# Patient Record
Sex: Female | Born: 1960 | Hispanic: Yes | Marital: Single | Smoking: Never smoker
Health system: Southern US, Community
[De-identification: ages and names within clinical notes are randomized; demographics above are authoritative.]

## PROBLEM LIST (undated history)

## (undated) DIAGNOSIS — H6090 Unspecified otitis externa, unspecified ear: Secondary | ICD-10-CM

## (undated) DIAGNOSIS — R011 Cardiac murmur, unspecified: Secondary | ICD-10-CM

## (undated) DIAGNOSIS — N926 Irregular menstruation, unspecified: Secondary | ICD-10-CM

## (undated) DIAGNOSIS — D509 Iron deficiency anemia, unspecified: Secondary | ICD-10-CM

## (undated) DIAGNOSIS — I10 Essential (primary) hypertension: Secondary | ICD-10-CM

## (undated) DIAGNOSIS — I8390 Asymptomatic varicose veins of unspecified lower extremity: Secondary | ICD-10-CM

## (undated) DIAGNOSIS — M1990 Unspecified osteoarthritis, unspecified site: Secondary | ICD-10-CM

## (undated) DIAGNOSIS — K219 Gastro-esophageal reflux disease without esophagitis: Secondary | ICD-10-CM

## (undated) HISTORY — PX: OTHER SURGICAL HISTORY: SHX169

## (undated) HISTORY — DX: Irregular menstruation, unspecified: N92.6

## (undated) HISTORY — DX: Cardiac murmur, unspecified: R01.1

## (undated) HISTORY — DX: Asymptomatic varicose veins of unspecified lower extremity: I83.90

## (undated) HISTORY — DX: Gastro-esophageal reflux disease without esophagitis: K21.9

## (undated) HISTORY — DX: Essential (primary) hypertension: I10

## (undated) HISTORY — DX: Unspecified osteoarthritis, unspecified site: M19.90

## (undated) HISTORY — DX: Unspecified otitis externa, unspecified ear: H60.90

## (undated) HISTORY — DX: Iron deficiency anemia, unspecified: D50.9

---

## 1999-05-24 ENCOUNTER — Emergency Department (HOSPITAL_COMMUNITY): Admission: EM | Admit: 1999-05-24 | Discharge: 1999-05-24 | Payer: Self-pay | Admitting: Emergency Medicine

## 1999-07-13 ENCOUNTER — Encounter: Admission: RE | Admit: 1999-07-13 | Discharge: 1999-07-13 | Payer: Self-pay | Admitting: Internal Medicine

## 1999-07-20 ENCOUNTER — Encounter: Admission: RE | Admit: 1999-07-20 | Discharge: 1999-07-20 | Payer: Self-pay | Admitting: Internal Medicine

## 1999-08-03 ENCOUNTER — Encounter: Admission: RE | Admit: 1999-08-03 | Discharge: 1999-08-03 | Payer: Self-pay | Admitting: Internal Medicine

## 1999-08-30 ENCOUNTER — Encounter: Admission: RE | Admit: 1999-08-30 | Discharge: 1999-08-30 | Payer: Self-pay | Admitting: Internal Medicine

## 2000-02-09 ENCOUNTER — Encounter: Admission: RE | Admit: 2000-02-09 | Discharge: 2000-02-09 | Payer: Self-pay | Admitting: Internal Medicine

## 2000-09-19 ENCOUNTER — Encounter: Admission: RE | Admit: 2000-09-19 | Discharge: 2000-09-19 | Payer: Self-pay | Admitting: Internal Medicine

## 2001-10-06 ENCOUNTER — Encounter: Admission: RE | Admit: 2001-10-06 | Discharge: 2001-10-06 | Payer: Self-pay | Admitting: Internal Medicine

## 2002-04-16 ENCOUNTER — Encounter: Admission: RE | Admit: 2002-04-16 | Discharge: 2002-04-16 | Payer: Self-pay | Admitting: Internal Medicine

## 2002-07-31 ENCOUNTER — Encounter: Admission: RE | Admit: 2002-07-31 | Discharge: 2002-07-31 | Payer: Self-pay | Admitting: Internal Medicine

## 2002-10-23 ENCOUNTER — Encounter: Admission: RE | Admit: 2002-10-23 | Discharge: 2002-10-23 | Payer: Self-pay | Admitting: Internal Medicine

## 2003-10-08 ENCOUNTER — Encounter: Admission: RE | Admit: 2003-10-08 | Discharge: 2003-10-08 | Payer: Self-pay | Admitting: Internal Medicine

## 2003-11-25 ENCOUNTER — Encounter: Admission: RE | Admit: 2003-11-25 | Discharge: 2003-11-25 | Payer: Self-pay | Admitting: Internal Medicine

## 2004-10-12 ENCOUNTER — Ambulatory Visit: Payer: Self-pay | Admitting: Internal Medicine

## 2005-11-13 ENCOUNTER — Ambulatory Visit: Payer: Self-pay | Admitting: Hospitalist

## 2005-12-11 ENCOUNTER — Ambulatory Visit (HOSPITAL_COMMUNITY): Admission: RE | Admit: 2005-12-11 | Discharge: 2005-12-11 | Payer: Self-pay | Admitting: Internal Medicine

## 2005-12-11 ENCOUNTER — Encounter (INDEPENDENT_AMBULATORY_CARE_PROVIDER_SITE_OTHER): Payer: Self-pay | Admitting: Internal Medicine

## 2005-12-24 ENCOUNTER — Encounter: Admission: RE | Admit: 2005-12-24 | Discharge: 2005-12-24 | Payer: Self-pay | Admitting: Internal Medicine

## 2005-12-24 ENCOUNTER — Encounter (INDEPENDENT_AMBULATORY_CARE_PROVIDER_SITE_OTHER): Payer: Self-pay | Admitting: Internal Medicine

## 2006-04-23 DIAGNOSIS — I10 Essential (primary) hypertension: Secondary | ICD-10-CM | POA: Insufficient documentation

## 2006-04-23 DIAGNOSIS — K219 Gastro-esophageal reflux disease without esophagitis: Secondary | ICD-10-CM

## 2006-05-16 DIAGNOSIS — N926 Irregular menstruation, unspecified: Secondary | ICD-10-CM

## 2006-05-16 DIAGNOSIS — D509 Iron deficiency anemia, unspecified: Secondary | ICD-10-CM | POA: Insufficient documentation

## 2006-12-05 ENCOUNTER — Telehealth (INDEPENDENT_AMBULATORY_CARE_PROVIDER_SITE_OTHER): Payer: Self-pay | Admitting: *Deleted

## 2006-12-06 DIAGNOSIS — R011 Cardiac murmur, unspecified: Secondary | ICD-10-CM

## 2006-12-06 HISTORY — DX: Cardiac murmur, unspecified: R01.1

## 2006-12-09 ENCOUNTER — Telehealth (INDEPENDENT_AMBULATORY_CARE_PROVIDER_SITE_OTHER): Payer: Self-pay | Admitting: *Deleted

## 2006-12-20 ENCOUNTER — Ambulatory Visit: Payer: Self-pay | Admitting: Infectious Disease

## 2006-12-20 ENCOUNTER — Encounter: Payer: Self-pay | Admitting: Internal Medicine

## 2006-12-20 LAB — CONVERTED CEMR LAB
BUN: 13 mg/dL (ref 6–23)
Calcium: 9.9 mg/dL (ref 8.4–10.5)
Chloride: 103 meq/L (ref 96–112)
Creatinine, Ser: 0.7 mg/dL (ref 0.40–1.20)
Glucose, Bld: 101 mg/dL — ABNORMAL HIGH (ref 70–99)
Potassium: 4.3 meq/L (ref 3.5–5.3)
Sodium: 139 meq/L (ref 135–145)

## 2006-12-23 ENCOUNTER — Telehealth (INDEPENDENT_AMBULATORY_CARE_PROVIDER_SITE_OTHER): Payer: Self-pay | Admitting: Pharmacy Technician

## 2007-01-15 ENCOUNTER — Ambulatory Visit: Payer: Self-pay | Admitting: Internal Medicine

## 2007-01-15 ENCOUNTER — Encounter (INDEPENDENT_AMBULATORY_CARE_PROVIDER_SITE_OTHER): Payer: Self-pay | Admitting: Internal Medicine

## 2007-01-15 DIAGNOSIS — I8390 Asymptomatic varicose veins of unspecified lower extremity: Secondary | ICD-10-CM | POA: Insufficient documentation

## 2007-01-15 LAB — CONVERTED CEMR LAB
ALT: 20 units/L (ref 0–35)
AST: 15 units/L (ref 0–37)
CO2: 26 meq/L (ref 19–32)
Chloride: 102 meq/L (ref 96–112)
Glucose, Bld: 107 mg/dL — ABNORMAL HIGH (ref 70–99)
HDL: 57 mg/dL (ref >39)
Hemoglobin: 12.5 g/dL (ref 12.0–15.0)
Platelets: 223 10*3/uL (ref 150–400)
Sodium: 139 meq/L (ref 135–145)
Total Bilirubin: 0.3 mg/dL (ref 0.3–1.2)
Total CHOL/HDL Ratio: 2.9
Total Protein: 7.4 g/dL (ref 6.0–8.3)
Triglycerides: 74 mg/dL (ref <150)
WBC: 6.6 10*3/uL (ref 4.0–10.5)

## 2007-02-12 ENCOUNTER — Encounter (INDEPENDENT_AMBULATORY_CARE_PROVIDER_SITE_OTHER): Payer: Self-pay | Admitting: Internal Medicine

## 2007-02-12 ENCOUNTER — Ambulatory Visit: Payer: Self-pay | Admitting: Internal Medicine

## 2007-02-12 DIAGNOSIS — R011 Cardiac murmur, unspecified: Secondary | ICD-10-CM

## 2007-02-12 LAB — CONVERTED CEMR LAB
BUN: 22 mg/dL (ref 6–23)
CO2: 25 meq/L (ref 19–32)
Glucose, Bld: 95 mg/dL (ref 70–99)
Potassium: 4.3 meq/L (ref 3.5–5.3)

## 2007-02-17 ENCOUNTER — Encounter (INDEPENDENT_AMBULATORY_CARE_PROVIDER_SITE_OTHER): Payer: Self-pay | Admitting: Internal Medicine

## 2007-02-19 ENCOUNTER — Ambulatory Visit (HOSPITAL_COMMUNITY): Admission: RE | Admit: 2007-02-19 | Discharge: 2007-02-19 | Payer: Self-pay | Admitting: Infectious Disease

## 2007-02-19 ENCOUNTER — Ambulatory Visit: Payer: Self-pay | Admitting: Cardiology

## 2007-05-23 ENCOUNTER — Ambulatory Visit: Payer: Self-pay | Admitting: Internal Medicine

## 2007-05-23 ENCOUNTER — Encounter (INDEPENDENT_AMBULATORY_CARE_PROVIDER_SITE_OTHER): Payer: Self-pay | Admitting: Internal Medicine

## 2007-05-23 LAB — CONVERTED CEMR LAB
Beta hcg, urine, semiquantitative: NEGATIVE
Protein, U semiquant: NEGATIVE
Urobilinogen, UA: 0.2

## 2007-05-26 LAB — CONVERTED CEMR LAB
BUN: 16 mg/dL (ref 6–23)
CO2: 27 meq/L (ref 19–32)
Calcium: 9.9 mg/dL (ref 8.4–10.5)
Chloride: 100 meq/L (ref 96–112)
Creatinine, Ser: 0.74 mg/dL (ref 0.40–1.20)
Hemoglobin: 14.5 g/dL (ref 12.0–15.0)
MCHC: 33.1 g/dL (ref 30.0–36.0)
MCV: 81.1 fL (ref 78.0–100.0)
RBC: 5.4 M/uL — ABNORMAL HIGH (ref 3.87–5.11)
Sodium: 138 meq/L (ref 135–145)
WBC: 8.4 10*3/uL (ref 4.0–10.5)

## 2007-05-28 ENCOUNTER — Ambulatory Visit (HOSPITAL_COMMUNITY): Admission: RE | Admit: 2007-05-28 | Discharge: 2007-05-28 | Payer: Self-pay | Admitting: Internal Medicine

## 2007-06-16 ENCOUNTER — Ambulatory Visit: Payer: Self-pay | Admitting: Internal Medicine

## 2007-06-24 ENCOUNTER — Ambulatory Visit: Payer: Self-pay | Admitting: Internal Medicine

## 2007-06-24 ENCOUNTER — Encounter (INDEPENDENT_AMBULATORY_CARE_PROVIDER_SITE_OTHER): Payer: Self-pay | Admitting: *Deleted

## 2007-07-10 ENCOUNTER — Ambulatory Visit: Payer: Self-pay | Admitting: Internal Medicine

## 2007-08-01 ENCOUNTER — Encounter (INDEPENDENT_AMBULATORY_CARE_PROVIDER_SITE_OTHER): Payer: Self-pay | Admitting: Internal Medicine

## 2007-08-21 ENCOUNTER — Ambulatory Visit: Payer: Self-pay | Admitting: Infectious Disease

## 2007-08-21 ENCOUNTER — Encounter (INDEPENDENT_AMBULATORY_CARE_PROVIDER_SITE_OTHER): Payer: Self-pay | Admitting: Internal Medicine

## 2007-08-21 LAB — CONVERTED CEMR LAB
CO2: 24 meq/L (ref 19–32)
Chloride: 103 meq/L (ref 96–112)
Glucose, Bld: 97 mg/dL (ref 70–99)
Potassium: 4 meq/L (ref 3.5–5.3)
Sodium: 139 meq/L (ref 135–145)

## 2007-09-22 ENCOUNTER — Ambulatory Visit: Payer: Self-pay | Admitting: *Deleted

## 2007-10-22 ENCOUNTER — Encounter (INDEPENDENT_AMBULATORY_CARE_PROVIDER_SITE_OTHER): Payer: Self-pay | Admitting: Internal Medicine

## 2007-10-22 ENCOUNTER — Ambulatory Visit: Payer: Self-pay | Admitting: *Deleted

## 2007-10-22 LAB — CONVERTED CEMR LAB
CO2: 26 meq/L (ref 19–32)
Calcium: 9.5 mg/dL (ref 8.4–10.5)
Chloride: 105 meq/L (ref 96–112)
HCT: 41.3 % (ref 36.0–46.0)
Hemoglobin: 13.6 g/dL (ref 12.0–15.0)
MCV: 84.5 fL (ref 78.0–100.0)
Platelets: 200 10*3/uL (ref 150–400)
RDW: 13.7 % (ref 11.5–15.5)
Sodium: 142 meq/L (ref 135–145)

## 2007-10-31 ENCOUNTER — Ambulatory Visit (HOSPITAL_COMMUNITY): Admission: RE | Admit: 2007-10-31 | Discharge: 2007-10-31 | Payer: Self-pay | Admitting: Internal Medicine

## 2007-10-31 LAB — HM MAMMOGRAPHY: HM Mammogram: NORMAL

## 2008-10-18 ENCOUNTER — Encounter: Payer: Self-pay | Admitting: Internal Medicine

## 2008-10-18 ENCOUNTER — Ambulatory Visit: Payer: Self-pay | Admitting: Internal Medicine

## 2008-10-18 ENCOUNTER — Encounter (INDEPENDENT_AMBULATORY_CARE_PROVIDER_SITE_OTHER): Payer: Self-pay | Admitting: Internal Medicine

## 2008-10-19 LAB — CONVERTED CEMR LAB
BUN: 19 mg/dL (ref 6–23)
Basophils Absolute: 0 10*3/uL (ref 0.0–0.1)
Basophils Relative: 0 % (ref 0–1)
Creatinine, Ser: 0.81 mg/dL (ref 0.40–1.20)
Eosinophils Absolute: 0.2 10*3/uL (ref 0.0–0.7)
Glucose, Bld: 88 mg/dL (ref 70–99)
Lymphocytes Relative: 31 % (ref 12–46)
MCHC: 32.6 g/dL (ref 30.0–36.0)
Monocytes Absolute: 0.5 10*3/uL (ref 0.1–1.0)
Neutro Abs: 4 10*3/uL (ref 1.7–7.7)
Neutrophils Relative %: 59 % (ref 43–77)
Platelets: 214 10*3/uL (ref 150–400)

## 2008-11-16 ENCOUNTER — Ambulatory Visit: Payer: Self-pay | Admitting: Internal Medicine

## 2008-11-16 ENCOUNTER — Encounter: Payer: Self-pay | Admitting: Internal Medicine

## 2008-11-17 LAB — CONVERTED CEMR LAB
ALT: 28 units/L (ref 0–35)
AST: 20 units/L (ref 0–37)
Albumin: 4.1 g/dL (ref 3.5–5.2)
Calcium: 9.4 mg/dL (ref 8.4–10.5)
Chloride: 99 meq/L (ref 96–112)
Creatinine, Ser: 0.79 mg/dL (ref 0.40–1.20)
Eosinophils Absolute: 0 10*3/uL (ref 0.0–0.7)
Eosinophils Relative: 0 % (ref 0–5)
Glucose, Bld: 105 mg/dL — ABNORMAL HIGH (ref 70–99)
HCT: 39.3 % (ref 36.0–46.0)
Lymphocytes Relative: 15 % (ref 12–46)
Monocytes Absolute: 1.3 10*3/uL — ABNORMAL HIGH (ref 0.1–1.0)
Neutro Abs: 11.3 10*3/uL — ABNORMAL HIGH (ref 1.7–7.7)
Neutrophils Relative %: 77 % (ref 43–77)
Platelets: 207 10*3/uL (ref 150–400)
RDW: 13.8 % (ref 11.5–15.5)
Total Bilirubin: 0.9 mg/dL (ref 0.3–1.2)
Total Protein: 7 g/dL (ref 6.0–8.3)

## 2008-11-18 ENCOUNTER — Encounter: Payer: Self-pay | Admitting: Internal Medicine

## 2008-11-18 ENCOUNTER — Ambulatory Visit: Payer: Self-pay | Admitting: Infectious Diseases

## 2008-11-19 ENCOUNTER — Ambulatory Visit: Payer: Self-pay | Admitting: Internal Medicine

## 2008-11-23 ENCOUNTER — Ambulatory Visit: Payer: Self-pay | Admitting: Internal Medicine

## 2008-11-23 ENCOUNTER — Encounter: Payer: Self-pay | Admitting: Internal Medicine

## 2008-11-24 IMAGING — CT CT ABDOMEN W/O CM
2 of 4 series · 17 of 46 positions shown, 19 images · IV contrast (agent unspecified)
Comparison: None available.

CLINICAL DATA: Low back pain, hematuria.   
 ABDOMEN CT WITHOUT CONTRAST:
TECHNIQUE: Multidetector CT imaging of the abdomen was performed following the standard protocol without IV contrast.
TECHNIQUE: Multidetector CT imaging of the pelvis was performed following the standard protocol without IV contrast.

[Series 2: renal stone 5.0 b31f st · axial · 0.71mm/px · z∈[+766,+1120]mm · 14 of 79 slices shown, 16 images]
[im 4/79  soft-tissue]
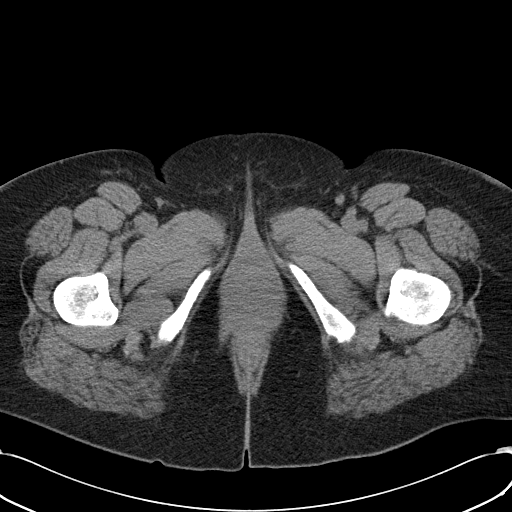
[im 4/79  bone]
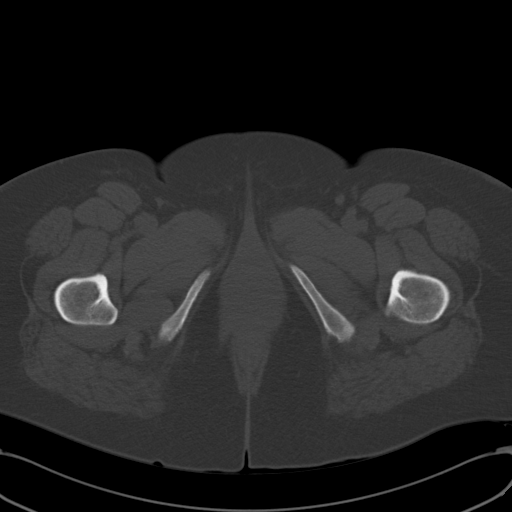
[im 10/79  soft-tissue]
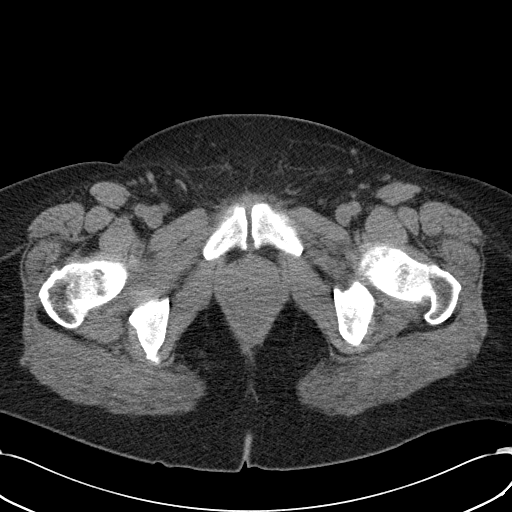
[im 16/79  soft-tissue]
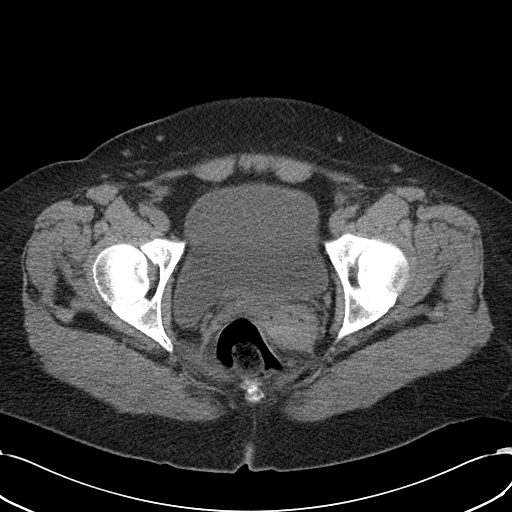
[im 22/79  soft-tissue]
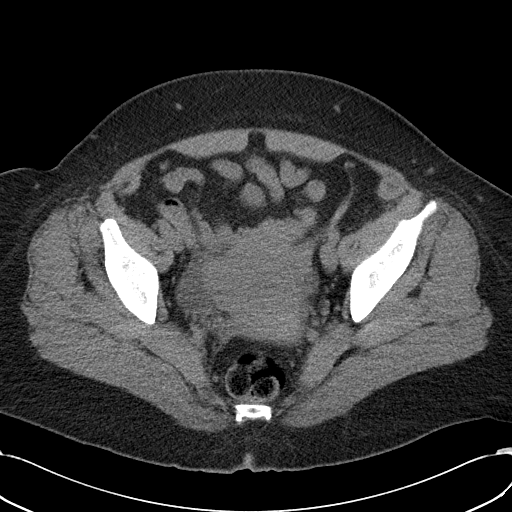
[im 25/79  soft-tissue]
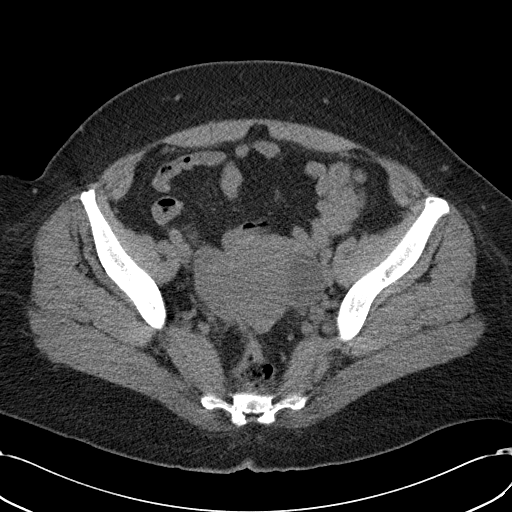
[im 32/79  soft-tissue]
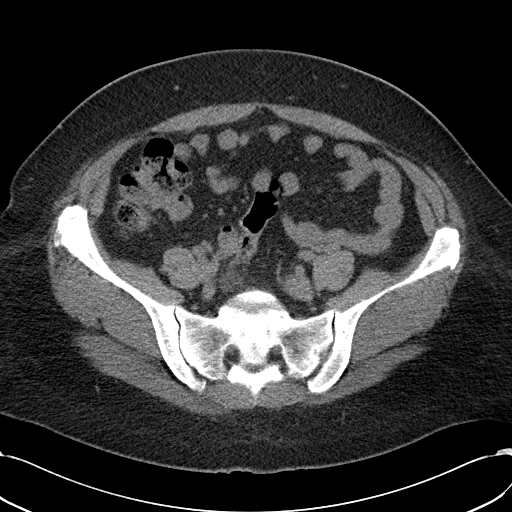
[im 38/79  soft-tissue]
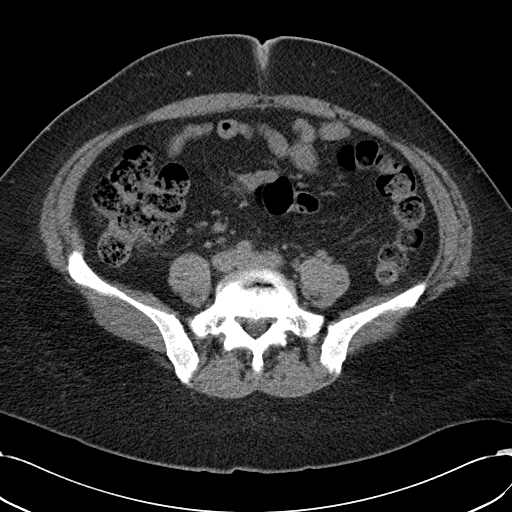
[im 41/79  soft-tissue]
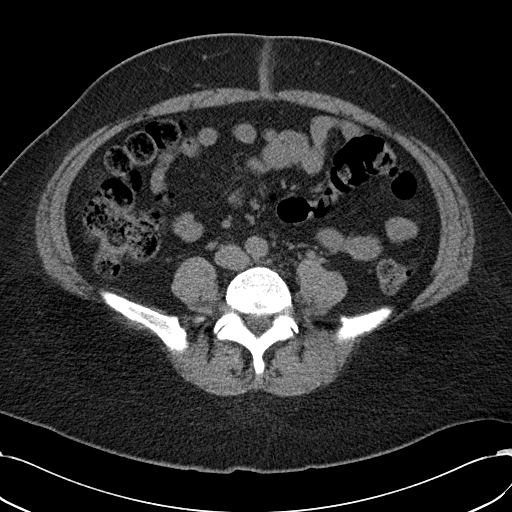
[im 47/79  soft-tissue]
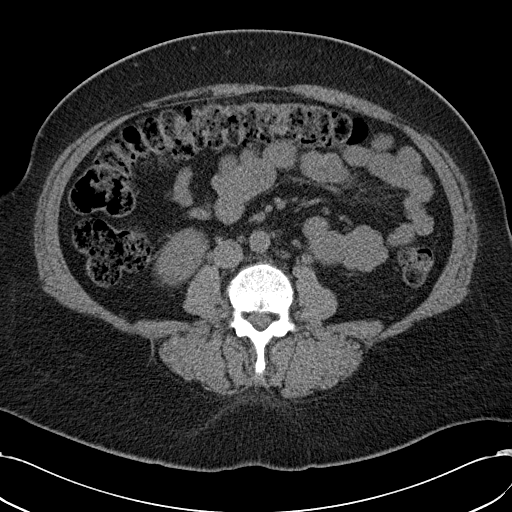
[im 47/79  bone]
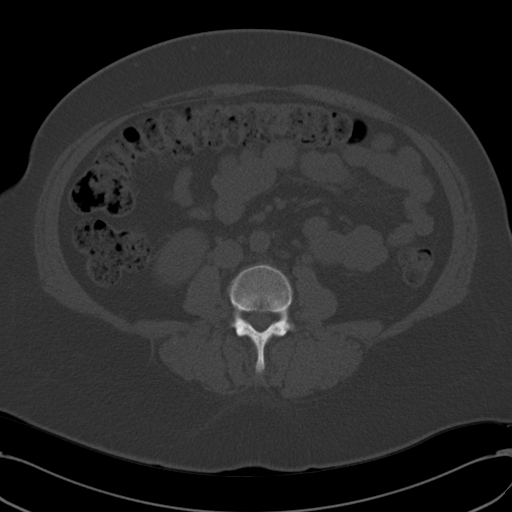
[im 54/79  soft-tissue]
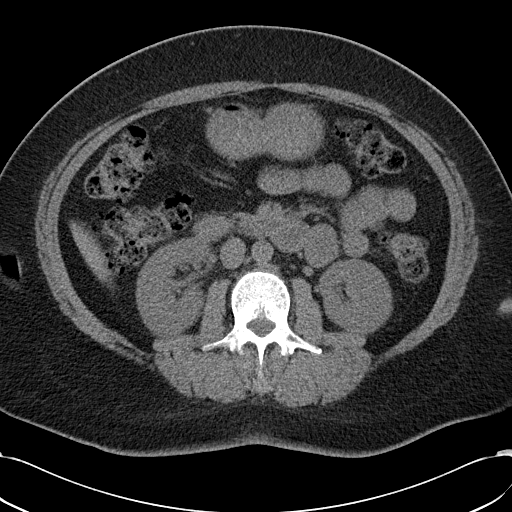
[im 60/79  soft-tissue]
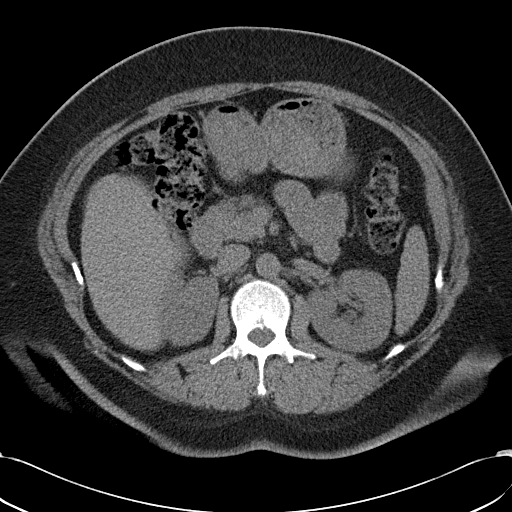
[im 63/79  soft-tissue]
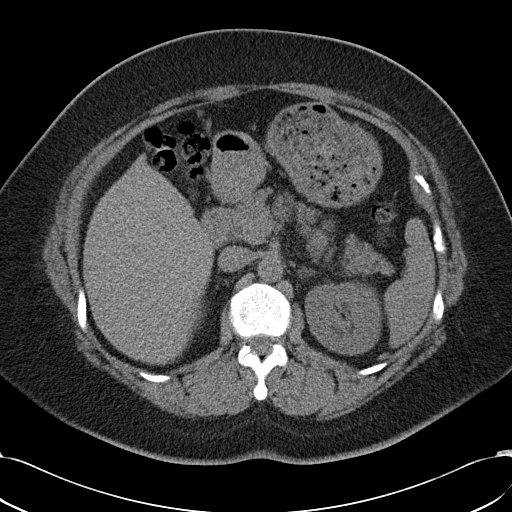
[im 69/79  soft-tissue]
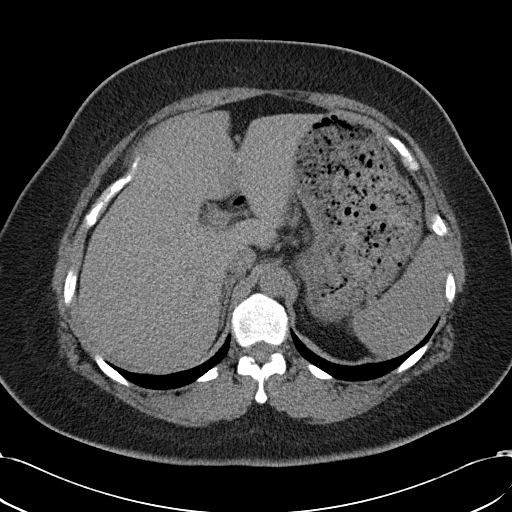
[im 75/79  soft-tissue]
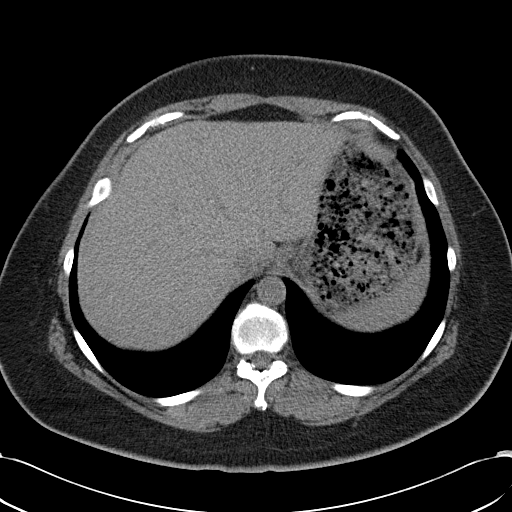

[Series 5: renal stone 5.0 spo cor st · coronal · 0.76mm/px · 3 of 53 slices shown]
[im 18/53  soft-tissue]
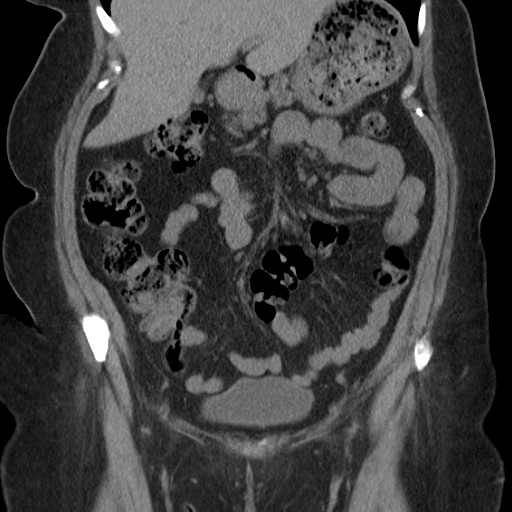
[im 24/53  soft-tissue]
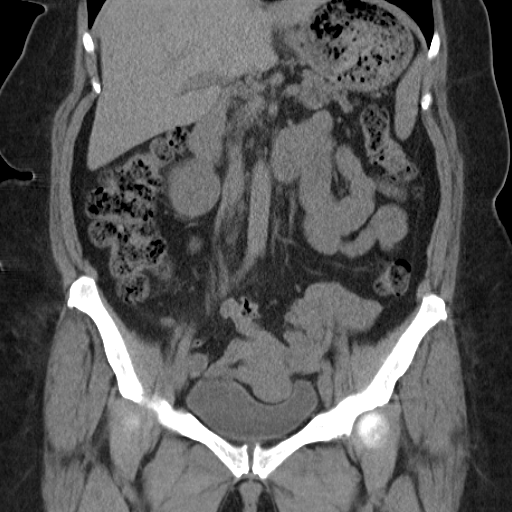
[im 29/53  soft-tissue]
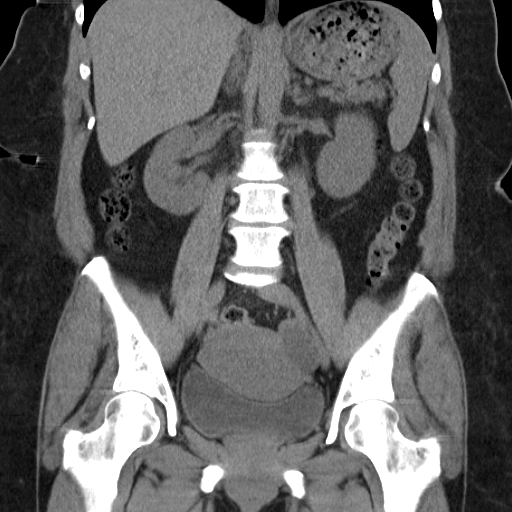

[17 of 46 positions shown; findings below may reference images not displayed]

FINDINGS: The visualized lung parenchyma is clear.  No pleural effusion is present.   The visualized portions of the liver, gallbladder, biliary tree, adrenal glands, spleen, and pancreas appear normal.  The kidneys have an unremarkable, uninfused appearance bilaterally.   No renal or ureteral stones or hydronephrosis is present.  The stomach and small bowel are unremarkable.  No abdominal lymphadenopathy or fluid collection.  No focal bony abnormality.
IMPRESSION: Negative abdomen CT scan.  No urinary tract stones are identified. 
 PELVIS CT WITHOUT CONTRAST:
FINDINGS: There is a left ovarian cyst measuring 3.6 cm AP x 2.8 cm transverse x 3.2 cm craniocaudal.  The right ovary appears normal.   A fibroid uterus is seen.  The colon and appendix appear normal.    No pelvic lymphadenopathy or fluid collection.  No bony abnormality.
IMPRESSION: 1.  3.6 cm left ovarian cyst. Recommend follow-up ultrasound in 6 weeks to insure resolution. 
 2.  Fibroid uterus noted.

## 2009-03-16 ENCOUNTER — Ambulatory Visit: Payer: Self-pay | Admitting: Infectious Disease

## 2009-03-17 LAB — CONVERTED CEMR LAB
Calcium: 10.2 mg/dL (ref 8.4–10.5)
Creatinine, Ser: 1.04 mg/dL (ref 0.40–1.20)
Glucose, Bld: 104 mg/dL — ABNORMAL HIGH (ref 70–99)

## 2009-03-28 ENCOUNTER — Emergency Department (HOSPITAL_COMMUNITY): Admission: EM | Admit: 2009-03-28 | Discharge: 2009-03-28 | Payer: Self-pay | Admitting: Family Medicine

## 2009-04-20 ENCOUNTER — Encounter: Payer: Self-pay | Admitting: Internal Medicine

## 2009-04-20 ENCOUNTER — Ambulatory Visit: Payer: Self-pay | Admitting: Internal Medicine

## 2009-04-20 DIAGNOSIS — M129 Arthropathy, unspecified: Secondary | ICD-10-CM | POA: Insufficient documentation

## 2009-04-27 ENCOUNTER — Telehealth: Payer: Self-pay | Admitting: Internal Medicine

## 2009-04-27 ENCOUNTER — Ambulatory Visit: Admission: RE | Admit: 2009-04-27 | Discharge: 2009-04-27 | Payer: Self-pay | Admitting: Internal Medicine

## 2009-04-27 ENCOUNTER — Ambulatory Visit: Payer: Self-pay | Admitting: Vascular Surgery

## 2009-04-27 ENCOUNTER — Encounter: Payer: Self-pay | Admitting: Internal Medicine

## 2009-05-23 ENCOUNTER — Ambulatory Visit: Payer: Self-pay

## 2009-05-23 ENCOUNTER — Encounter: Payer: Self-pay | Admitting: Internal Medicine

## 2009-10-06 ENCOUNTER — Telehealth (INDEPENDENT_AMBULATORY_CARE_PROVIDER_SITE_OTHER): Payer: Self-pay | Admitting: *Deleted

## 2009-10-20 ENCOUNTER — Encounter: Payer: Self-pay | Admitting: Internal Medicine

## 2009-10-20 ENCOUNTER — Ambulatory Visit: Payer: Self-pay

## 2009-10-21 LAB — CONVERTED CEMR LAB
ALT: 20 units/L (ref 0–35)
BUN: 21 mg/dL (ref 6–23)
CO2: 30 meq/L (ref 19–32)
Chloride: 101 meq/L (ref 96–112)
Sed Rate: 5 mm/hr (ref 0–22)
Total Bilirubin: 0.3 mg/dL (ref 0.3–1.2)

## 2010-01-30 ENCOUNTER — Telehealth: Payer: Self-pay | Admitting: *Deleted

## 2010-01-31 ENCOUNTER — Encounter: Payer: Self-pay | Admitting: Internal Medicine

## 2010-01-31 ENCOUNTER — Ambulatory Visit: Admission: RE | Admit: 2010-01-31 | Discharge: 2010-01-31 | Payer: Self-pay | Admitting: Internal Medicine

## 2010-01-31 ENCOUNTER — Ambulatory Visit: Payer: Self-pay | Admitting: Vascular Surgery

## 2010-01-31 ENCOUNTER — Ambulatory Visit: Payer: Self-pay | Admitting: Internal Medicine

## 2010-03-23 ENCOUNTER — Telehealth (INDEPENDENT_AMBULATORY_CARE_PROVIDER_SITE_OTHER): Payer: Self-pay | Admitting: *Deleted

## 2010-03-23 ENCOUNTER — Ambulatory Visit: Payer: Self-pay | Admitting: Internal Medicine

## 2010-03-23 DIAGNOSIS — J309 Allergic rhinitis, unspecified: Secondary | ICD-10-CM | POA: Insufficient documentation

## 2010-03-23 DIAGNOSIS — H60339 Swimmer's ear, unspecified ear: Secondary | ICD-10-CM | POA: Insufficient documentation

## 2010-04-12 ENCOUNTER — Telehealth: Payer: Self-pay | Admitting: Internal Medicine

## 2010-05-29 ENCOUNTER — Encounter: Payer: Self-pay | Admitting: Internal Medicine

## 2010-05-30 ENCOUNTER — Telehealth (INDEPENDENT_AMBULATORY_CARE_PROVIDER_SITE_OTHER): Payer: Self-pay | Admitting: *Deleted

## 2010-06-06 NOTE — Progress Notes (Signed)
Summary: phone/gg  Phone Note Call from Patient   Summary of Call: Pt came into clinic with c/o left leg pain and swelling for 3 weeks. No appointments available today so scheduled for tomorrow AM. Pt is ambulatory.  Does not speak english so called for interpreter for tomorrow. Initial call taken by: Merrie Roof RN,  January 30, 2010 9:25 AM     Appended Document: phone/gg     Vital Signs:  Patient profile:   50 year old female Height:      64.4 inches Weight:      210.7 pounds BMI:     35.85 Temp:     97.0 degrees F oral Pulse rate:   64 / minute BP sitting:   158 / 81  (right arm)  Vitals Entered By: Filomena Jungling NT II (January 31, 2010 8:55 AM) CC: LEFT HIP AND LEG PAIN, FOOT NUMB AND HURTS X 3 WEEKS Is Patient Diabetic? No Pain Assessment Patient in pain? yes     Location: hip, foot, and leg Intensity: 4-5 Type: aching Onset of pain  3 weeks Nutritional Status BMI of > 30 = obese  Does patient need assistance? Functional Status Self care Ambulation Normal   CC:  LEFT HIP AND LEG PAIN and FOOT NUMB AND HURTS X 3 WEEKS.  Preventive Screening-Counseling & Management  Alcohol-Tobacco     Alcohol drinks/day: <1     Smoking Status: never  Caffeine-Diet-Exercise     Does Patient Exercise: yes     Type of exercise: WALKING     Exercise Counseling: to improve exercise regimen  Allergies: No Known Drug Allergies   Complete Medication List: 1)  Hydrochlorothiazide 25 Mg Tabs (Hydrochlorothiazide) .... Once daily 2)  Bufferin Low Dose 81 Mg Tbec (Aspirin) .... Take 1 tablet by mouth once a day 3)  Lisinopril 10 Mg Tabs (Lisinopril) .... Take 1 tablet by mouth once a day 4)  Ibuprofen 200 Mg Tabs (Ibuprofen) .... Take 1 tablet every 6 hour for pain as needed. 5)  Tramadol Hcl 50 Mg Tabs (Tramadol hcl) .... Take 1 tablet by mouth once a day. 6)  Ranitidine Hcl 150 Mg Caps (Ranitidine hcl) .... Take 1 tablet by mouth two times a day   Prevention &  Chronic Care Immunizations   Influenza vaccine: Not documented    Tetanus booster: Not documented    Pneumococcal vaccine: Not documented  Other Screening   Pap smear: normal  (02/12/2007)   Pap smear due: 02/2008    Mammogram: No specific mammographic evidence of malignancy.  Assessment: BIRADS 1.   (10/31/2007)   Mammogram due: 11/2008   Smoking status: never  (01/31/2010)  Lipids   Total Cholesterol: 163  (01/15/2007)   LDL: 91  (01/15/2007)   LDL Direct: Not documented   HDL: 57  (01/15/2007)   Triglycerides: 74  (01/15/2007)  Hypertension   Last Blood Pressure: 158 / 81  (01/31/2010)   Serum creatinine: 1.17  (10/20/2009)   Serum potassium 4.3  (10/20/2009)  Self-Management Support :   Personal Goals (by the next clinic visit) :      Personal blood pressure goal: 140/90  (10/20/2009)   Patient will work on the following items until the next clinic visit to reach self-care goals:     Medications and monitoring: take my medicines every day  (01/31/2010)     Eating: drink diet soda or water instead of juice or soda, eat more vegetables, use fresh or frozen vegetables, eat foods that are  low in salt, eat baked foods instead of fried foods, eat fruit for snacks and desserts  (01/31/2010)     Activity: take a 30 minute walk every day  (01/31/2010)    Hypertension self-management support: Education handout, Resources for patients handout  (01/31/2010)   Hypertension education handout printed      Resource handout printed.

## 2010-06-06 NOTE — Progress Notes (Signed)
Summary: PREVENTIVE COLONOSCOPY  Phone Note Outgoing Call   Summary of Call: Patient is under the age of 50.  No information in regards to a Colonoscopy being performed. Initial call taken by: Shon Hough,  March 23, 2010 10:53 AM

## 2010-06-06 NOTE — Assessment & Plan Note (Signed)
Vital Signs:  Patient profile:   50 year old female Height:      64.4 inches Temp:     97.8 degrees F oral Pulse rate:   59 / minute BP sitting:   117 / 71  (right arm)  Vitals Entered By: Filomena Jungling NT II (May 23, 2009 4:07 PM)  Primary Care Provider:  Hartley Barefoot MD   History of Present Illness: 50 year old with Past Medical History: GERD Hypertension Irregular menses  She is still having pain. She is taking advil every  6 hours. Pain has improved, she is now able to bend the knee. The pain is worse after she is walking for while.    Current Medications (verified): 1)  Hydrochlorothiazide 25 Mg Tabs (Hydrochlorothiazide) .... Once Daily 2)  Bufferin Low Dose 81 Mg  Tbec (Aspirin) .... Take 1 Tablet By Mouth Once A Day 3)  Lisinopril 10 Mg  Tabs (Lisinopril) .... Take 1 Tablet By Mouth Once A Day 4)  Voltaren 1 % Gel (Diclofenac Sodium) .... Apply 4 G Four Time A Day On Your Leg. 5)  Ibuprofen 200 Mg Tabs (Ibuprofen) .... Take 1 Tablet Every 6 Hour For Pain As Needed. 6)  Tylenol With Codeine #3 300-30 Mg Tabs (Acetaminophen-Codeine) .... Take 1 Tablet Every 8 Hour For Pain As Needed. 7)  Tramadol Hcl 50 Mg Tabs (Tramadol Hcl) .... Take 1 Tablet By Mouth Once A Day. 8)  Ranitidine Hcl 150 Mg Caps (Ranitidine Hcl) .... Take 1 Tablet Twice A Day.  Allergies: No Known Drug Allergies  Review of Systems  The patient denies fever, chest pain, syncope, dyspnea on exertion, peripheral edema, prolonged cough, headaches, hemoptysis, abdominal pain, melena, hematochezia, and severe indigestion/heartburn.    Physical Exam  General:  alert, well-developed, and well-nourished.   Head:  normocephalic, atraumatic, and no abnormalities observed.   Lungs:  normal respiratory effort, no intercostal retractions, no accessory muscle use, and normal breath sounds.   Heart:  normal rate and regular rhythm.   Abdomen:  soft, non-tender, normal bowel sounds, and no distention.   Msk:   normal ROM, no joint tenderness, no joint swelling, no joint warmth, no redness over joints, no joint deformities, and  crepitation right knee.   Extremities:  no edema.  Neurologic:  alert & oriented X3, cranial nerves II-XII intact, and strength normal in all extremities.     Impression & Recommendations:  Problem # 1:  KNEE PAIN, RIGHT (ICD-719.46) She has mild osteoarthritis. I will continue with ibuprofen. She will try tyleno # 3. She is only taking tramadol at night. I will refer to physical therapy for muscle strenght exercise.  Her updated medication list for this problem includes:    Bufferin Low Dose 81 Mg Tbec (Aspirin) .Marland Kitchen... Take 1 tablet by mouth once a day    Ibuprofen 200 Mg Tabs (Ibuprofen) .Marland Kitchen... Take 1 tablet every 6 hour for pain as needed.    Tylenol With Codeine #3 300-30 Mg Tabs (Acetaminophen-codeine) .Marland Kitchen... Take 1 tablet every 8 hour for pain as needed.    Tramadol Hcl 50 Mg Tabs (Tramadol hcl) .Marland Kitchen... Take 1 tablet by mouth once a day.  Orders: Physical Therapy Referral (PT)  Problem # 2:  GERD (ICD-530.81) She relates that omeprazole is too expensive. I will start ranitidine.  The following medications were removed from the medication list:    Eql Omeprazole 20 Mg Tbec (Omeprazole) .Marland Kitchen... Take 1 tablet once a day. Her updated medication list for  this problem includes:    Ranitidine Hcl 150 Mg Caps (Ranitidine hcl) .Marland Kitchen... Take 1 tablet twice a day.  Problem # 3:  HYPERTENSION (ICD-401.9) Blood pressure well controlled. Continue with current regimen.  Her updated medication list for this problem includes:    Hydrochlorothiazide 25 Mg Tabs (Hydrochlorothiazide) ..... Once daily    Lisinopril 10 Mg Tabs (Lisinopril) .Marland Kitchen... Take 1 tablet by mouth once a day  BP today: 117/71 Prior BP: 122/73 (04/20/2009)  Labs Reviewed: K+: 3.9 (03/16/2009) Creat: : 1.04 (03/16/2009)   Chol: 163 (01/15/2007)   HDL: 57 (01/15/2007)   LDL: 91 (01/15/2007)   TG: 74  (01/15/2007)  Problem # 4:  VARICOSE VEINS, LOWER EXTREMITIES (ICD-454.9) No swelling. Doppler negative.   Complete Medication List: 1)  Hydrochlorothiazide 25 Mg Tabs (Hydrochlorothiazide) .... Once daily 2)  Bufferin Low Dose 81 Mg Tbec (Aspirin) .... Take 1 tablet by mouth once a day 3)  Lisinopril 10 Mg Tabs (Lisinopril) .... Take 1 tablet by mouth once a day 4)  Voltaren 1 % Gel (Diclofenac sodium) .... Apply 4 g four time a day on your leg. 5)  Ibuprofen 200 Mg Tabs (Ibuprofen) .... Take 1 tablet every 6 hour for pain as needed. 6)  Tylenol With Codeine #3 300-30 Mg Tabs (Acetaminophen-codeine) .... Take 1 tablet every 8 hour for pain as needed. 7)  Tramadol Hcl 50 Mg Tabs (Tramadol hcl) .... Take 1 tablet by mouth once a day. 8)  Ranitidine Hcl 150 Mg Caps (Ranitidine hcl) .... Take 1 tablet twice a day.  Patient Instructions: 1)  Please schedule a follow-up appointment in 3 months. Prescriptions: RANITIDINE HCL 150 MG CAPS (RANITIDINE HCL) Take 1 tablet twice a day.  #60 x 4   Entered and Authorized by:   Hartley Barefoot MD   Signed by:   Hartley Barefoot MD on 05/23/2009   Method used:   Print then Give to Patient   RxID:   [id number] TYLENOL WITH CODEINE #3 300-30 MG TABS (ACETAMINOPHEN-CODEINE) Take 1 tablet every 8 hour for pain as needed.  #60 x 1   Entered and Authorized by:   Hartley Barefoot MD   Signed by:   Hartley Barefoot MD on 05/23/2009   Method used:   Print then Give to Patient   RxID:   [id number]

## 2010-06-06 NOTE — Assessment & Plan Note (Signed)
Vital Signs:  Patient profile:   50 year old female Height:      64.4 inches (163.58 cm) Weight:      212.7 pounds (96.68 kg) BMI:     36.19 Temp:     97.6 degrees F (36.44 degrees C) oral Pulse rate:   72 / minute BP sitting:   134 / 84  (right arm) Cuff size:   large  Vitals Entered By: Theotis Barrio NT II (October 20, 2009 2:39 PM) CC: COMPLAIN OF PAIN IN THE LEFT WRIST X 6 MONTHS / ROUTINE OFFICE VISIT Is Patient Diabetic? No Nutritional Status BMI of > 30 = obese  Have you ever been in a relationship where you felt threatened, hurt or afraid?No   Does patient need assistance? Functional Status Self care Ambulation Normal   Primary Care Provider:  Hartley Barefoot MD  CC:  COMPLAIN OF PAIN IN THE LEFT WRIST X 6 MONTHS / ROUTINE OFFICE VISIT.  History of Present Illness: Allison Cook is a 50 yo woman with PMH as oultined below.  She is here for arthragias.  She was last seen in clinic and told she had mild osteoarthritis of the right knee.  She reports also having some pain on lateral aspect of right foot, left wrist and left knee.  She states advil worked in the past but more recently it hasn't.  She has been advised to take ibuprofen 800mg  but has not been able to find it in pharmacy and only uses 400mg  daily.  Also uses tramadol at night to help her sleep.  She did take it in the morning twice and didn't feel good with it.  Also reports very physical work that is probably aggrevating pain.  Also reports new myalgias of thighs and fatigue.  Preventive Screening-Counseling & Management  Alcohol-Tobacco     Alcohol drinks/day: <1     Smoking Status: never  Caffeine-Diet-Exercise     Does Patient Exercise: yes     Type of exercise: WALKING     Exercise Counseling: to improve exercise regimen  Current Medications (verified): 1)  Hydrochlorothiazide 25 Mg Tabs (Hydrochlorothiazide) .... Once Daily 2)  Bufferin Low Dose 81 Mg  Tbec (Aspirin) .... Take 1 Tablet By Mouth Once  A Day 3)  Lisinopril 10 Mg  Tabs (Lisinopril) .... Take 1 Tablet By Mouth Once A Day 4)  Ibuprofen 200 Mg Tabs (Ibuprofen) .... Take 1 Tablet Every 6 Hour For Pain As Needed. 5)  Tramadol Hcl 50 Mg Tabs (Tramadol Hcl) .... Take 1 Tablet By Mouth Once A Day.  Allergies (verified): No Known Drug Allergies  Past History:  Past Medical History: Last updated: 04/23/2006 GERD Hypertension Irregular menses Anemia-iron deficiency  Past Surgical History: Last updated: 10/18/2008 None  Family History: Last updated: 10/18/2008 Mom- HTN  Social History: Last updated: 11/19/2008 Single, immigrated from [place] in 1999.  Lives at home by herself. Denies any EtOH, drugs or tobacco. 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    Risk Factors: Alcohol Use: <1 (10/20/2009) Exercise: yes (10/20/2009)  Risk Factors: Smoking Status: never (10/20/2009)  Social History: Does Patient Exercise:  yes  Review of Systems      See HPI  Physical Exam  General:  alert, well-developed, and well-nourished.   Eyes:  anicteric Lungs:  normal respiratory effort and no accessory muscle use.   Neurologic:  alert & oriented X3, cranial nerves II-XII intact, strength normal in all extremities, and gait normal.   Psych:  Oriented X3,  memory intact for recent and remote, and normally interactive.     Impression & Recommendations:  Problem # 1:  ARTHRITIS, GENERALIZED (ICD-716.99)  Will check ESR, CRP and CK Will advise ibuprofen 600mg  by mouth three times a day with meals continue tramadol at bedtime as needed    Orders: T-CK Total (16109-60454) T-Sed Rate (Automated) (09811-91478) T-C-Reactive Protein (29562-13086) T-TSH ([id number])  Problem # 2:  HYPERTENSION (ICD-401.9) BP well controlled   Her updated medication list for this problem includes:    Hydrochlorothiazide 25 Mg Tabs (Hydrochlorothiazide) ..... Once daily    Lisinopril 10 Mg Tabs (Lisinopril) .Marland Kitchen... Take 1 tablet by mouth once  a day  BP today: 98/62  >> 134/84 upon recheck in room by myself Prior BP: 117/71 (05/23/2009)  Labs Reviewed: K+: 3.9 (03/16/2009) Creat: : 1.04 (03/16/2009)   Chol: 163 (01/15/2007)   HDL: 57 (01/15/2007)   LDL: 91 (01/15/2007)   TG: 74 (01/15/2007)  Orders: T-Comprehensive Metabolic Panel ([id number])  Problem # 3:  GERD (ICD-530.81) Will rewrite for ranitidine 150mg  two times a day as it is on the $4 list at [facility] and pt has insurance  The following medications were removed from the medication list:    Ranitidine Hcl 150 Mg Caps (Ranitidine hcl) .Marland Kitchen... Take 1 tablet twice a day. Her updated medication list for this problem includes:    Ranitidine Hcl 150 Mg Caps (Ranitidine hcl) .Marland Kitchen... Take 1 tablet by mouth two times a day  Complete Medication List: 1)  Hydrochlorothiazide 25 Mg Tabs (Hydrochlorothiazide) .... Once daily 2)  Bufferin Low Dose 81 Mg Tbec (Aspirin) .... Take 1 tablet by mouth once a day 3)  Lisinopril 10 Mg Tabs (Lisinopril) .... Take 1 tablet by mouth once a day 4)  Ibuprofen 200 Mg Tabs (Ibuprofen) .... Take 1 tablet every 6 hour for pain as needed. 5)  Tramadol Hcl 50 Mg Tabs (Tramadol hcl) .... Take 1 tablet by mouth once a day. 6)  Ranitidine Hcl 150 Mg Caps (Ranitidine hcl) .... Take 1 tablet by mouth two times a day  Patient Instructions: 1)  Please schedule a follow-up appointment in 3 months. 2)  Usar ibuprofen (advil) 3 tabletas (600mg ) tres veces diarias con comida. 3)  Continuar usando tramadol por la noche 4)  Usar ranitidine 150mg  dos veces diarias para la indigestion. 5)  Si ay algun problema con los laboratorios la llamaremos. 6)  Si tienes algun problema antes de su proxima visita, llame a la clinica.  Prescriptions: RANITIDINE HCL 150 MG CAPS (RANITIDINE HCL) Take 1 tablet by mouth two times a day  #60 x 3   Entered and Authorized by:   Mariea Stable MD   Signed by:   Mariea Stable MD on 10/20/2009   Method used:   Electronically to          [facility] [phone]* (retail)       [address]       [place], [place]  [address]       Ph: [phone]       Fax: [phone]   RxID:   [id number]  Process Orders Check Orders Results:     [facility]: ABN not required for this insurance Tests Sent for requisitioning (October 20, 2009 3:13 PM):     10/20/2009: [facility] -- T-Comprehensive Metabolic Panel [[id number]] (signed)     10/20/2009: [facility] -- T-CK Total [[id number]] (signed)     10/20/2009: [facility] -- T-Sed Rate (Automated) [  [id number]] (signed)     10/20/2009: [facility] -- T-C-Reactive Protein [phone] (signed)     10/20/2009: [facility] -- T-TSH [phone] (signed)    Prevention & Chronic Care Immunizations   Influenza vaccine: Not documented    Tetanus booster: Not documented    Pneumococcal vaccine: Not documented  Other Screening   Pap smear: normal  (02/12/2007)   Pap smear due: 02/2008    Mammogram: No specific mammographic evidence of malignancy.  Assessment: BIRADS 1.   (10/31/2007)   Mammogram due: 11/2008   Smoking status: never  (10/20/2009)  Lipids   Total Cholesterol: 163  (01/15/2007)   LDL: 91  (01/15/2007)   LDL Direct: Not documented   HDL: 57  (01/15/2007)   Triglycerides: 74  (01/15/2007)  Hypertension   Last Blood Pressure: 134 / 84  (10/20/2009)   Serum creatinine: 1.04  (03/16/2009)   Serum potassium 3.9  (03/16/2009) CMP ordered   Self-Management Support :   Personal Goals (by the next clinic visit) :      Personal blood pressure goal: 140/90  (10/20/2009)   Patient will work on the following items until the next clinic visit to reach self-care goals:     Medications and monitoring: take my medicines every day  (10/20/2009)     Eating: drink diet soda or water instead of juice or soda, eat more vegetables, use fresh or frozen vegetables,  eat foods that are low in salt, eat baked foods instead of fried foods, eat fruit for snacks and desserts, limit or avoid alcohol  (10/20/2009)     Activity: take a 30 minute walk every day  (10/20/2009)    Hypertension self-management support: Resources for patients handout  (10/20/2009)      Resource handout printed.

## 2010-06-06 NOTE — Assessment & Plan Note (Signed)
Summary: RA/NEEDS ROUTINE CHECKUP/CH   Vital Signs:  Patient profile:   50 year old female Height:      64.4 inches (163.58 cm) Weight:      213.2 pounds (96.91 kg) BMI:     36.27 Temp:     97.2 degrees F (36.22 degrees C) oral Pulse rate:   79 / minute BP sitting:   122 / 74  (left arm)  Vitals Entered By: Allison Kidney Ditzler RN (March 23, 2010 3:05 PM) CC: leg pain Is Patient Diabetic? No Pain Assessment Patient in pain? yes     Location: bone pain Intensity: ? Type: ? Onset of pain  problems understanding interpreter Nutritional Status BMI of > 30 = obese Nutritional Status Detail appetite good  Have you ever been in a relationship where you felt threatened, hurt or afraid?denies   Does patient need assistance? Functional Status Self care Ambulation Normal Comments Ck-ups and discuss bone pain and meds.   Primary Care Provider:  Danelle Berry, MD  CC:  leg pain.  History of Present Illness: [name] is a 50 year old woman with pmh significant for Arthritis and HTN who presents with complaint of stomach burning and reflux symptoms related to taking tramadol. States her symptoms started only after taking tramadol and she is only taking tramadol at night for this reason. Is taking ibuprofen during the day and though this does not work as well for her leg/back pain and arthritis, she is tolerating this combination. Is interested in availability of better medicine for her stomach pain that is not expensive.  No difficulty or pain with swallowing. Her symptoms correlate exactly to when she takes tramadol.    Patient also complains of left ear tenderness and itching - this has been going on for a few weeks and she has tried rinsing her ear with hydrogen peroxide with little effect.  No drainage from the ear.  No change in hearing.  She has also had some sinus drainage at this time and blames this on her seasonal allergies.  She states she is all out of her steroid nasal spray.     Patient also desires flu shot and tetanus shot today.    Depression History:      The patient denies a depressed mood most of the day and a diminished interest in her usual daily activities.         Preventive Screening-Counseling & Management  Alcohol-Tobacco     Alcohol drinks/day: <1     Smoking Status: never  Caffeine-Diet-Exercise     Does Patient Exercise: yes     Type of exercise: WALKING     Exercise Counseling: to improve exercise regimen  Current Medications (verified): 1)  Hydrochlorothiazide 25 Mg Tabs (Hydrochlorothiazide) .... Once Daily 2)  Bufferin Low Dose 81 Mg  Tbec (Aspirin) .... Take 1 Tablet By Mouth Once A Day 3)  Lisinopril 10 Mg  Tabs (Lisinopril) .... Take 1 Tablet By Mouth Once A Day 4)  Ibuprofen 200 Mg Tabs (Ibuprofen) .... Take 1 Tablet Every 6 Hour For Pain As Needed. 5)  Tramadol Hcl 50 Mg Tabs (Tramadol Hcl) .... Take 1 Tablet By Mouth Once A Day. 6)  Gabapentin 100 Mg Caps (Gabapentin) .... Take 1 Tab By Mouth At Bedtime 7)  Omeprazole 20 Mg Tbec (Omeprazole) .... Take 1 Tablet By Mouth Once A Day 8)  Cipro Hc 0.2-1 % Susp (Ciprofloxacin-Hydrocortisone) .... Put 3 Drops in Your Left Ear Twice Daily For 7 Days 9)  Fluticasone Propionate 50 Mcg/act Susp (Fluticasone Propionate) .... Use 2 Sprays in Each Nostril Daily  Allergies (verified): No Known Drug Allergies  Past History:  Past medical, surgical, family and social histories (including risk factors) reviewed, and no changes noted (except as noted below).  Past Medical History: Reviewed history from 04/23/2006 and no changes required. GERD Hypertension Irregular menses Anemia-iron deficiency  Past Surgical History: Reviewed history from 10/18/2008 and no changes required. None  Family History: Reviewed history from 10/18/2008 and no changes required. Mom- HTN  Social History: Reviewed history from 11/19/2008 and no changes required. Single, immigrated from [place] in 1999.    Lives at home by herself. Denies any EtOH, drugs or tobacco. 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    Review of Systems  The patient denies fever, weight loss, weight gain, vision loss, chest pain, syncope, dyspnea on exertion, headaches, melena, and hematochezia.         see HPI  Physical Exam  General:  NAD Ears:  left external ear canal inflammed; TM intact without drainage or fluid.  some tenderness on palpation of external canal and manipulation of left ear.  Right ear and canal wnl. Nose:  some clear drainage from bilateral nares with some injection of turbinates. Mouth:  pharynx pink and moist, no erythema, no exudates, no posterior lymphoid hypertrophy, and no lesions.   Neck:  supple, full ROM, and no masses.   Chest Wall:  no tenderness.   Lungs:  normal respiratory effort, normal breath sounds, no crackles, and no wheezes.   Heart:  normal rate, regular rhythm, and no murmur.   Abdomen:  soft, non-tender, and normal bowel sounds.   Msk:   normal ROM. mild joint tenderness with active/passive movement bilateral knees.  No joint swelling/warmth. Pulses:  bilateral 2+ pedal pulses Extremities:  trace left pedal edema and trace right pedal edema.   Neurologic:  alert & oriented X3, cranial nerves II-XII intact, strength normal in all extremities, sensation intact to light touch, and gait normal.   Skin:  turgor normal, color normal, and no rashes.   Cervical Nodes:  no anterior cervical adenopathy.   Psych:  Oriented X3, memory intact for recent and remote, normally interactive, good eye contact, not anxious appearing, and not depressed appearing.     Impression & Recommendations:  Problem # 1:  ARTHRITIS, GENERALIZED (ICD-716.99) Assessment Unchanged Stable, just that NSAIDs irritate the patient's stomach.  See problem #3.  Will continue current NSAID regimen for now (ibuprofen 600mg  two times a day and toradol at night) but may need to consider switch to mobic as this  has  less GI side effects if ibuprofen/toradol with PPI does not work.  Problem # 2:  HYPERTENSION (ICD-401.9) Assessment: Unchanged At goal - continue current medications.  Her updated medication list for this problem includes:    Hydrochlorothiazide 25 Mg Tabs (Hydrochlorothiazide) ..... Once daily    Lisinopril 10 Mg Tabs (Lisinopril) .Marland Kitchen... Take 1 tablet by mouth once a day  Problem # 3:  GERD (ICD-530.81) Assessment: Deteriorated Patient is going to work with her insurance company (she did not know that they ahve people who speak Spanish who can help her navigate her Rx drug plan) so that she can afford omeprazole.  Given she needs to take NSAIDs for her OA (doens't tolerate tylenol, we don't want to use narcs on her yet), perhaps can achieve better tolerability of NSAIDs with PPI instead of H2 blocker.  Will have pt return in 1-2  months to see how this regimen works for her.  The following medications were removed from the medication list:    Ranitidine Hcl 150 Mg Caps (Ranitidine hcl) .Marland Kitchen... Take 1 tablet by mouth two times a day Her updated medication list for this problem includes:    Omeprazole 20 Mg Tbec (Omeprazole) .Marland Kitchen... Take 1 tablet by mouth once a day  Problem # 4:  OTITIS EXTERNA, ACUTE, LEFT (ICD-380.12) Assessment: New Will try 7 day course of cipro otic drops for otitis externa. Instructed pt to not put anything including Q-tips into her ears.    Her updated medication list for this problem includes:    Cipro Hc 0.2-1 % Susp (Ciprofloxacin-hydrocortisone) .Marland Kitchen... Put 3 drops in your left ear twice daily for 7 days  Problem # 5:  ALLERGIC RHINITIS (ICD-477.9) Assessment: Deteriorated Worsening symptoms recently.  Will refill flonase prescription.  Her updated medication list for this problem includes:    Fluticasone Propionate 50 Mcg/act Susp (Fluticasone propionate) ..... Use 2 sprays in each nostril daily  Problem # 6:  Preventive Health Care (ICD-V70.0) Assessment:  Comment Only Will address at next visit - of note, pt will need Pap smear at next visit (1-2 months).  Complete Medication List: 1)  Hydrochlorothiazide 25 Mg Tabs (Hydrochlorothiazide) .... Once daily 2)  Bufferin Low Dose 81 Mg Tbec (Aspirin) .... Take 1 tablet by mouth once a day 3)  Lisinopril 10 Mg Tabs (Lisinopril) .... Take 1 tablet by mouth once a day 4)  Ibuprofen 200 Mg Tabs (Ibuprofen) .... Take 1 tablet every 6 hour for pain as needed. 5)  Tramadol Hcl 50 Mg Tabs (Tramadol hcl) .... Take 1 tablet by mouth once a day. 6)  Gabapentin 100 Mg Caps (Gabapentin) .... Take 1 tab by mouth at bedtime 7)  Omeprazole 20 Mg Tbec (Omeprazole) .... Take 1 tablet by mouth once a day 8)  Cipro Hc 0.2-1 % Susp (Ciprofloxacin-hydrocortisone) .... Put 3 drops in your left ear twice daily for 7 days 9)  Fluticasone Propionate 50 Mcg/act Susp (Fluticasone propionate) .... Use 2 sprays in each nostril daily  Other Orders: Influenza Vaccine NON MCR (16109) Tdap => 30yrs IM (60454) Admin 1st Vaccine (09811)  Patient Instructions: 1)  Por favor toma una pastilla de omeprazole al dia para ayudar con el gastritis. Puede usted llamar a [employer] para ver si [employer] puedan ayudar con esa medicina. 2)  Sigue tomando ibuprofen 200mg  3 pastillas dos veces al dia y tomando tramadol 1 pastilla en la noche. 3)  [place] las gotas en el oido izquierdo - 3 gotas en el oido dos veces al dia por 7 dias. 4)  [place] fluticasone spray 2 esprays en cada fosa nasal al dia cuando esta tapada. Prescriptions: FLUTICASONE PROPIONATE 50 MCG/ACT SUSP (FLUTICASONE PROPIONATE) Use 2 sprays in each nostril daily  #1 x 11   Entered and Authorized by:   Danelle Berry, MD   Signed by:   Danelle Berry, MD on 03/23/2010   Method used:   Electronically to        [facility] [phone]* (retail)       [address]       [place], [place]  [address]       Ph: [phone]       Fax: [phone]   RxID:    [id number] CIPRO HC 0.2-1 % SUSP (CIPROFLOXACIN-HYDROCORTISONE) Put 3 drops in your left ear twice daily for 7 days  #1 x 0  Entered and Authorized by:   Danelle Berry, MD   Signed by:   Danelle Berry, MD on 03/23/2010   Method used:   Electronically to        [facility] [phone]* (retail)       [address]       [place], [place]  [address]       Ph: [phone]       Fax: [phone]   RxID:   [id number] OMEPRAZOLE 20 MG TBEC (OMEPRAZOLE) Take 1 tablet by mouth once a day  #30 x 11   Entered and Authorized by:   Danelle Berry, MD   Signed by:   Danelle Berry, MD on 03/23/2010   Method used:   Electronically to        [facility] [phone]* (retail)       [address]       [place], [place]  [address]       Ph: [phone]       Fax: [phone]   RxID:   [id number]    Orders Added: 1)  Influenza Vaccine NON MCR [00028] 2)  Tdap => 55yrs IM [30160] 3)  Admin 1st Vaccine [90471] 4)  New Patient Level IV [10932]   Immunizations Administered:  Influenza Vaccine # 1:    Vaccine Type: Fluvax Non-MCR    Site: left deltoid    Mfr: GlaxoSmithKline    Dose: 0.5 ml    Route: IM    Given by: Allison Kidney Ditzler RN    Exp. Date: 11/04/2010    Lot #: [id number]    VIS given: 11/29/09 version given March 23, 2010.  Tetanus Vaccine:    Vaccine Type: Tdap    Site: right deltoid    Mfr: GlaxoSmithKline    Dose: 0.5 ml    Route: IM    Given by: Allison Kidney Ditzler RN    Exp. Date: 02/24/2012    Lot #: [id number]    VIS given: 03/24/08 version given March 23, 2010.  Flu Vaccine Consent Questions:    Do you have a history of severe allergic reactions to this vaccine? no    Any prior history of allergic reactions to egg and/or gelatin? no    Do you have a sensitivity to the preservative Thimersol? no    Do you have a past history of Guillan-Barre Syndrome? no    Do you currently have an acute febrile illness? no    Have you ever had a severe  reaction to latex? no    Vaccine information given and explained to patient? yes    Are you currently pregnant? no   Immunizations Administered:  Influenza Vaccine # 1:    Vaccine Type: Fluvax Non-MCR    Site: left deltoid    Mfr: GlaxoSmithKline    Dose: 0.5 ml    Route: IM    Given by: Allison Kidney Ditzler RN    Exp. Date: 11/04/2010    Lot #: [id number]    VIS given: 11/29/09 version given March 23, 2010.  Tetanus Vaccine:    Vaccine Type: Tdap    Site: right deltoid    Mfr: GlaxoSmithKline    Dose: 0.5 ml    Route: IM    Given by: Allison Kidney Ditzler RN    Exp. Date: 02/24/2012    Lot #: [id number]    VIS given: 03/24/08 version given March 23, 2010.   Prevention & Chronic Care Immunizations   Influenza vaccine: Fluvax Non-MCR  (03/23/2010)  Tetanus booster: 03/23/2010: Tdap    Pneumococcal vaccine: Not documented  Other Screening   Pap smear: normal  (02/12/2007)   Pap smear action/deferral: Deferred  (01/31/2010)   Pap smear due: 02/2008    Mammogram: No specific mammographic evidence of malignancy.  Assessment: BIRADS 1.   (10/31/2007)   Mammogram action/deferral: Deferred to age 58  (01/31/2010)   Mammogram due: 11/2008   Smoking status: never  (03/23/2010)  Lipids   Total Cholesterol: 163  (01/15/2007)   LDL: 91  (01/15/2007)   LDL Direct: Not documented   HDL: 57  (01/15/2007)   Triglycerides: 74  (01/15/2007)  Hypertension   Last Blood Pressure: 122 / 74  (03/23/2010)   Serum creatinine: 1.17  (10/20/2009)   Serum potassium 4.3  (10/20/2009)  Self-Management Support :   Personal Goals (by the next clinic visit) :      Personal blood pressure goal: 140/90  (10/20/2009)   Patient will work on the following items until the next clinic visit to reach self-care goals:     Medications and monitoring: take my medicines every day, bring all of my medications to every visit  (03/23/2010)     Eating: eat more vegetables, use fresh or frozen vegetables,  eat foods that are low in salt, eat fruit for snacks and desserts, limit or avoid alcohol  (03/23/2010)     Activity: take a 30 minute walk every day  (01/31/2010)    Hypertension self-management support: Written self-care plan, Education handout, Resources for patients handout  (03/23/2010)   Hypertension self-care plan printed.   Hypertension education handout printed      Resource handout printed.    Appended Document: RA/NEEDS ROUTINE CHECKUP/CH I discussed the patient with Dr. Claudette Laws and I agree with the assessment and plan as outlined above. Trial of PPI to see if helps with pain and if not, trial of changing IBU to Mobic. No need for endoscipoy / H pylori testing today.

## 2010-06-06 NOTE — Progress Notes (Signed)
Summary: refill/gg  Phone Note Refill Request   Refills Requested: Medication #1:  HYDROCHLOROTHIAZIDE 25 MG TABS once daily  Medication #2:  LISINOPRIL 10 MG  TABS Take 1 tablet by mouth once a day  Medication #3:  TRAMADOL HCL 50 MG TABS Take 1 tablet by mouth once a day.  Follow-up for Phone Call        Rx faxed to pharmacy Follow-up by: Angelina Ok RN,  October 07, 2009 11:17 AM    Prescriptions: TRAMADOL HCL 50 MG TABS (TRAMADOL HCL) Take 1 tablet by mouth once a day.  #30 x 0   Entered and Authorized by:   Zoila Shutter MD   Signed by:   Zoila Shutter MD on 10/07/2009   Method used:   Electronically to        [facility] [phone]* (retail)       [address]       [place], [place]  [address]       Ph: [phone]       Fax: [phone]   RxID:   [id number] LISINOPRIL 10 MG  TABS (LISINOPRIL) Take 1 tablet by mouth once a day  #90 x 3   Entered and Authorized by:   Zoila Shutter MD   Signed by:   Zoila Shutter MD on 10/07/2009   Method used:   Electronically to        [facility] [phone]* (retail)       [address]       [place], [place]  [address]       Ph: [phone]       Fax: [phone]   RxID:   [id number] HYDROCHLOROTHIAZIDE 25 MG TABS (HYDROCHLOROTHIAZIDE) once daily  #90 x 3   Entered and Authorized by:   Zoila Shutter MD   Signed by:   Zoila Shutter MD on 10/07/2009   Method used:   Electronically to        [facility] [phone]* (retail)       [address]       [place], [place]  [address]       Ph: [phone]       Fax: [phone]   RxID:   [id number]

## 2010-06-06 NOTE — Assessment & Plan Note (Signed)
Vital Signs:  Patient profile:   50 year old female Height:      64.4 inches Weight:      210.7 pounds BMI:     35.85 Temp:     97.0 degrees F oral Pulse rate:   64 / minute BP sitting:   158 / 81  (right arm)   Vitals Entered By: Filomena Jungling NT II (January 31, 2010 8:55 AM) CC: LEFT HIP AND LEG PAIN, FOOT NUMB AND HURTS X 3 WEEKS Is Patient Diabetic? No Pain Assessment Patient in pain? yes     Location: hip, foot, and leg Intensity: 4-5 Type: aching Onset of pain  3 weeks Nutritional Status BMI of > 30 = obese  Does patient need assistance? Functional Status Self care Ambulation Normal   CC:  LEFT HIP AND LEG PAIN and FOOT NUMB AND HURTS X 3 WEEKS.   History of Present Illness: [name] is a 50 year old woman with pmh significant for Arthritis, anemia and HTN who presents for:  Patient is spanish speaking patient and is present with her translator.  1) Left leg/foot pain - Pain starts in left hip and radiates down leg. The pain began three weeks ago. Denies trauma to foot. She complains that it is her left foot pain and numbness that is most bothersome. Pt states her big toe and heel are numb and she experiences tingling. No previous episodes. Pt also complains of left foot swelling. Patient states she was given Tramadol at last visit, and that it helps right leg pain, but not helping her left leg. She complains of leg weakness and that her left leg will give out on her.   No other complaints or concerns today.    Preventive Screening-Counseling & Management  Alcohol-Tobacco     Alcohol drinks/day: <1     Smoking Status: never  Caffeine-Diet-Exercise     Does Patient Exercise: yes     Type of exercise: WALKING     Exercise Counseling: to improve exercise regimen [Clinical Review Form]  Allergies: No Known Drug Allergies [PMH-PSH-CCC] [FH-SH-CCC] [ROS-CCC-2] [PE-CCC]  [Problems-CCC]  Complete Medication List: 1)  Hydrochlorothiazide 25 Mg Tabs  (Hydrochlorothiazide) .... Once daily 2)  Bufferin Low Dose 81 Mg Tbec (Aspirin) .... Take 1 tablet by mouth once a day 3)  Lisinopril 10 Mg Tabs (Lisinopril) .... Take 1 tablet by mouth once a day 4)  Ibuprofen 200 Mg Tabs (Ibuprofen) .... Take 1 tablet every 6 hour for pain as needed. 5)  Tramadol Hcl 50 Mg Tabs (Tramadol hcl) .... Take 1 tablet by mouth once a day. 6)  Ranitidine Hcl 150 Mg Caps (Ranitidine hcl) .... Take 1 tablet by mouth two times a day [Patient Instructions-CCC] [Prescriptions]   Prevention & Chronic Care Immunizations   Influenza vaccine: Not documented    Tetanus booster: Not documented    Pneumococcal vaccine: Not documented  Other Screening   Pap smear: normal  (02/12/2007)   Pap smear due: 02/2008    Mammogram: No specific mammographic evidence of malignancy.  Assessment: BIRADS 1.   (10/31/2007)   Mammogram due: 11/2008   Smoking status: never  (01/31/2010)  Lipids   Total Cholesterol: 163  (01/15/2007)   LDL: 91  (01/15/2007)   LDL Direct: Not documented   HDL: 57  (01/15/2007)   Triglycerides: 74  (01/15/2007)  Hypertension   Last Blood Pressure: 158 / 81  (01/31/2010)   Serum creatinine: 1.17  (10/20/2009)   Serum potassium 4.3  (10/20/2009)  Self-Management Support :   Personal Goals (by the next clinic visit) :      Personal blood pressure goal: 140/90  (10/20/2009)   Patient will work on the following items until the next clinic visit to reach self-care goals:     Medications and monitoring: take my medicines every day  (01/31/2010)     Eating: drink diet soda or water instead of juice or soda, eat more vegetables, use fresh or frozen vegetables, eat foods that are low in salt, eat baked foods instead of fried foods, eat fruit for snacks and desserts  (01/31/2010)     Activity: take a 30 minute walk every day  (01/31/2010)   Impression & Recommendations:  Problem # 1:  VARICOSE VEINS, LOWER EXTREMITIES (ICD-454.9) Assessment  Deteriorated Pain in lower left extremity is likely secondary to phlebitis. There is no obvious swelling, however patient does complain of some numbness and tingling. Will get dopplers to rule out DVT. Advised patient to continue with ibuprofen and tramadol for pain relief. Pt states voltaren gel did not help. There is also a component of neuropathic pain. Will also try patient on gabapentin.   Orders: LE Venous Duplex (DVT) (DVT)  Problem # 2:  HYPERTENSION (ICD-401.9) Assessment: Deteriorated Deteriorated secondary to pain, stressors at home, as well as combination of just taking meds before appt, so it might have not taken into effect yet. Will monitor closely before making adjustments to her regimen.   Her updated medication list for this problem includes:    Hydrochlorothiazide 25 Mg Tabs (Hydrochlorothiazide) ..... Once daily    Lisinopril 10 Mg Tabs (Lisinopril) .Marland Kitchen... Take 1 tablet by mouth once a day  BP today: 158/81 Prior BP: 134/84  Labs Reviewed: K+: 4.3 (10/20/2009) Creat: : 1.17 (10/20/2009)   Chol: 163 (01/15/2007)   HDL: 57 (01/15/2007)   LDL: 91 (01/15/2007)   TG: 74 (01/15/2007)  Complete Medication List: 1)  Hydrochlorothiazide 25 Mg Tabs (Hydrochlorothiazide) .... Once daily 2)  Bufferin Low Dose 81 Mg Tbec (Aspirin) .... Take 1 tablet by mouth once a day 3)  Lisinopril 10 Mg Tabs (Lisinopril) .... Take 1 tablet by mouth once a day 4)  Ibuprofen 200 Mg Tabs (Ibuprofen) .... Take 1 tablet every 6 hour for pain as needed. 5)  Tramadol Hcl 50 Mg Tabs (Tramadol hcl) .... Take 1 tablet by mouth once a day. 6)  Ranitidine Hcl 150 Mg Caps (Ranitidine hcl) .... Take 1 tablet by mouth two times a day 7)  Gabapentin 100 Mg Caps (Gabapentin) .... Take 1 tab by mouth at bedtime   Physical Exam  General:  alert and well-nourished.  VS reviewed Head:  normocephalic and atraumatic.   Neck:  supple.   Lungs:  normal respiratory effort and normal breath sounds.   Heart:   normal rate and regular rhythm.   Abdomen:  soft and non-tender.   Msk:  normal ROM, no joint tenderness, no joint swelling, no joint warmth, and no redness over joints.   Pulses:  pulses were normal 2+ DP bilaterally Extremities:  no edema noted bilaterally no external swelling of left foot varicose veins present, do not appear engorged no warmth or tenderness  Neurologic:  alert & oriented X3, sensation intact to light touch, and gait normal.     Primary Care Provider:  Hartley Barefoot MD   History of Present Illness: [name] is a 50 year old woman with pmh significant for Arthritis, anemia and HTN who presents for:  Patient is  spanish speaking patient and is present with her translator.  1) Left leg/foot pain - Pain starts in left hip and radiates down leg. The pain began three weeks ago. Denies trauma to foot. She complains that it is her left foot pain and numbness that is most bothersome. Pt states her big toe and heel are numb and she experiences tingling. No previous episodes. Pt also complains of left foot swelling. Patient states she was given Tramadol at last visit, and that it helps right leg pain, but not helping her left leg. She complains of leg weakness and that her left leg will give out on her.   No other complaints or concerns today.     Past History:  Past Medical History: Last updated: 04/23/2006 GERD Hypertension Irregular menses Anemia-iron deficiency  Past Surgical History: Last updated: 10/18/2008 None  Family History: Last updated: 10/18/2008 Mom- HTN  Social History: Last updated: 11/19/2008 Single, immigrated from [place] in 1999.  Lives at home by herself. Denies any EtOH, drugs or tobacco. 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    Risk Factors: Alcohol Use: <1 (01/31/2010) Exercise: yes (01/31/2010)  Risk Factors: Smoking Status: never (01/31/2010)   Review of Systems MS:  Complains of joint pain, loss of strength, low back  pain, and muscle weakness. Neuro:  Complains of numbness, tingling, and weakness.   Vital Signs:  Patient profile:   50 year old female Height:      64.4 inches Weight:      2107 pounds Pulse rate:   64 / minute BP sitting:   158 / 81   Current Medications (verified): 1)  Hydrochlorothiazide 25 Mg Tabs (Hydrochlorothiazide) .... Once Daily 2)  Bufferin Low Dose 81 Mg  Tbec (Aspirin) .... Take 1 Tablet By Mouth Once A Day 3)  Lisinopril 10 Mg  Tabs (Lisinopril) .... Take 1 Tablet By Mouth Once A Day 4)  Ibuprofen 200 Mg Tabs (Ibuprofen) .... Take 1 Tablet Every 6 Hour For Pain As Needed. 5)  Tramadol Hcl 50 Mg Tabs (Tramadol Hcl) .... Take 1 Tablet By Mouth Once A Day. 6)  Ranitidine Hcl 150 Mg Caps (Ranitidine Hcl) .... Take 1 Tablet By Mouth Two Times A Day  Allergies (verified): No Known Drug Allergies     Patient Instructions: 1)  Please take Gabapentin at bedtime. 2)  Please follow up in 3 months.    Prevention & Chronic Care Immunizations   Influenza vaccine: Not documented    Tetanus booster: Not documented    Pneumococcal vaccine: Not documented  Other Screening   Pap smear: normal  (02/12/2007)   Pap smear action/deferral: Deferred  (01/31/2010)   Pap smear due: 02/2008    Mammogram: No specific mammographic evidence of malignancy.  Assessment: BIRADS 1.   (10/31/2007)   Mammogram action/deferral: Deferred to age 83  (01/31/2010)   Mammogram due: 11/2008   Smoking status: never  (01/31/2010)  Lipids   Total Cholesterol: 163  (01/15/2007)   LDL: 91  (01/15/2007)   LDL Direct: Not documented   HDL: 57  (01/15/2007)   Triglycerides: 74  (01/15/2007)  Hypertension   Last Blood Pressure: 158 / 81  (01/31/2010)   Serum creatinine: 1.17  (10/20/2009)   Serum potassium 4.3  (10/20/2009)    Hypertension flowsheet reviewed?: Yes   Progress toward BP goal: Deteriorated  Self-Management Support :   Personal Goals (by the next clinic visit) :       Personal blood pressure goal: 140/90  (10/20/2009)  Hypertension self-management support: Animator, Resources for patients handout  (01/31/2010)   Nursing Instructions: Give Flu vaccine today    Prescriptions: GABAPENTIN 100 MG CAPS (GABAPENTIN) Take 1 tab by mouth at bedtime  #31 x 3   Entered and Authorized by:   Melida Quitter MD   Signed by:   Melida Quitter MD on 01/31/2010   Method used:   Print then Give to Patient   RxID:   [id number]

## 2010-06-08 NOTE — Progress Notes (Signed)
Summary: PAP and Mammo results   Phone Note Outgoing Call   Summary of Call: PAP results found per [facility] for February 12, 2007  PAP deferred January 31, 2010  Mammogram deferred 01-31-2010  Alric Quan  May 30, 2010 10:38 AM

## 2010-06-08 NOTE — Progress Notes (Signed)
Summary: refill/ hla  Phone Note Refill Request Message from:  Patient on April 12, 2010 10:48 AM  Refills Requested: Medication #1:  TRAMADOL HCL 50 MG TABS Take 1 tablet by mouth once a day.   Dosage confirmed as above?Dosage Confirmed   Last Refilled: 6/3 Initial call taken by: Marin Roberts RN,  April 12, 2010 10:48 AM    Prescriptions: TRAMADOL HCL 50 MG TABS (TRAMADOL HCL) Take 1 tablet by mouth once a day.  #30 x 0   Entered and Authorized by:   Danelle Berry, MD   Signed by:   Danelle Berry, MD on 04/21/2010   Method used:   Electronically to        [facility] [phone]* (retail)       [address]       [place], [place]  [address]       Ph: [phone]       Fax: [phone]   RxID:   [id number]

## 2010-06-15 ENCOUNTER — Ambulatory Visit (INDEPENDENT_AMBULATORY_CARE_PROVIDER_SITE_OTHER): Payer: BC Managed Care – PPO | Admitting: Internal Medicine

## 2010-06-15 ENCOUNTER — Encounter: Payer: Self-pay | Admitting: Internal Medicine

## 2010-06-15 ENCOUNTER — Other Ambulatory Visit (HOSPITAL_COMMUNITY)
Admission: RE | Admit: 2010-06-15 | Discharge: 2010-06-15 | Disposition: A | Discharge status: Home / Self Care | Payer: BC Managed Care – PPO | Source: Ambulatory Visit | Attending: Internal Medicine | Admitting: Internal Medicine

## 2010-06-15 VITALS — BP 159/102 | HR 94 | Temp 97.3°F | Ht 64.0 in | Wt 207.5 lb

## 2010-06-15 DIAGNOSIS — M129 Arthropathy, unspecified: Secondary | ICD-10-CM

## 2010-06-15 DIAGNOSIS — I10 Essential (primary) hypertension: Secondary | ICD-10-CM

## 2010-06-15 DIAGNOSIS — Z01419 Encounter for gynecological examination (general) (routine) without abnormal findings: Secondary | ICD-10-CM | POA: Insufficient documentation

## 2010-06-15 DIAGNOSIS — K219 Gastro-esophageal reflux disease without esophagitis: Secondary | ICD-10-CM

## 2010-06-15 DIAGNOSIS — I8390 Asymptomatic varicose veins of unspecified lower extremity: Secondary | ICD-10-CM

## 2010-06-15 DIAGNOSIS — J309 Allergic rhinitis, unspecified: Secondary | ICD-10-CM

## 2010-06-15 DIAGNOSIS — Z0000 Encounter for general adult medical examination without abnormal findings: Secondary | ICD-10-CM

## 2010-06-15 DIAGNOSIS — L089 Local infection of the skin and subcutaneous tissue, unspecified: Secondary | ICD-10-CM | POA: Insufficient documentation

## 2010-06-15 LAB — COMPREHENSIVE METABOLIC PANEL
ALT: 16 U/L (ref 0–35)
AST: 16 U/L (ref 0–37)
Albumin: 4.4 g/dL (ref 3.5–5.2)
Alkaline Phosphatase: 68 U/L (ref 39–117)
BUN: 23 mg/dL (ref 6–23)
CO2: 28 mEq/L (ref 19–32)
Chloride: 102 mEq/L (ref 96–112)
Glucose, Bld: 99 mg/dL (ref 70–99)
Potassium: 4.8 mEq/L (ref 3.5–5.3)
Total Bilirubin: 0.3 mg/dL (ref 0.3–1.2)
Total Protein: 6.7 g/dL (ref 6.0–8.3)

## 2010-06-15 MED ORDER — DOXYCYCLINE HYCLATE 100 MG PO TABS: ORAL_TABLET | ORAL | Status: AC

## 2010-06-15 MED ORDER — OMEPRAZOLE 20 MG PO CPDR: 20.0000 mg | DELAYED_RELEASE_CAPSULE | Freq: Every day | ORAL | Status: DC

## 2010-06-15 MED ORDER — TRAMADOL HCL 50 MG PO TABS: 50.0000 mg | ORAL_TABLET | Freq: Every day | ORAL | Status: DC

## 2010-06-15 MED ORDER — HYDROCHLOROTHIAZIDE 25 MG PO TABS: 25.0000 mg | ORAL_TABLET | Freq: Every day | ORAL | Status: DC

## 2010-06-15 MED ORDER — GABAPENTIN 100 MG PO CAPS: 100.0000 mg | ORAL_CAPSULE | Freq: Every day | ORAL | Status: DC

## 2010-06-15 NOTE — Assessment & Plan Note (Addendum)
Patient stable, ibuprofen and tramadol working well for her in conjunction with PPI.  Will need to watch renal function, especially in light of HTN (see above)

## 2010-06-15 NOTE — Assessment & Plan Note (Signed)
Patient's BP elevated at this visit - will check BMet today and if Cr stable (she has had increase from previous baseline of 0.8 in mid-2010 to 1.17 in June 2011), will increase lisinopril to 20mg  PO daily.  Pt complains of headaches - these may very well be 2/2 to her poorly controlled HTN.

## 2010-06-15 NOTE — Assessment & Plan Note (Signed)
Stable, doing well with flonase.

## 2010-06-15 NOTE — Assessment & Plan Note (Signed)
Patient doing great on PPI.  Will refill.

## 2010-06-15 NOTE — Assessment & Plan Note (Signed)
Per the patient the low dose gabapentin works well for her and she does not suffer any ill effects.  Will refill this today.

## 2010-06-15 NOTE — Patient Instructions (Addendum)
Hemos tomado sangre para chequear la funcion de sus rinones.  Si todo [employer], le voy a llamar y darle una receta para lisinopril 20mg  porque su presion no esta controlada.  En este caso va a ser necesario regresar a Emergency planning/management officer de 2 semanas para hacer una preuba mas de [place] porque esta medicina puede afectar a los rinones.   Siga tomando sus [employer] he dicho.   Tiene usted una infeccion de la piel - por eso le he dado una receta para un antibiotico que se llama doxycycline.  Toma 2 tabletas el primer dia, y despues una tableta al dia. Quiero que usted regresa [employer].

## 2010-06-15 NOTE — Assessment & Plan Note (Signed)
Pap smear performed today.  If normal, next due in 3 years.  Pt declined STD testing.

## 2010-06-15 NOTE — Progress Notes (Signed)
Subjective:    Patient ID: Allison Cook, female    DOB: Dec 23, 1960, 50 y.o.   MRN: [id number]  HPI Patient presents for follow-up appointment and has multiple complaints.  She believes that her blood pressure has been elevated and has been giving her headaches because she has been very stressed out from her finances.  Her headache is all over her head, relieved by ibuprofen, mild, squeezing at her temples, and associated with periods of stress.    Also complains of some skin pustules that she has noticed for the past few weeks on the skin of her upper thighs, stomach, and chest.  Denies any fever or chills with this.  Some drainage from these lesions.  This has happened to her in the past and these lesions have burst after she squeezed them.    She states that her arthritis is well controlled with ibuprofen and tramadol but she would like refills of these medicines.  She endorses compliance with her medicines.  Patient complains of some cough at night when she lays down to go to sleep.  Non productive, chronic - associated with warm dry air.    Complains of constipation intermittently as well.     Review of Systems  Constitutional: Negative for fever, chills and diaphoresis.  HENT: Negative for congestion, sore throat and trouble swallowing.   Eyes: Negative for visual disturbance.  Respiratory: Negative for cough, chest tightness and shortness of breath.   Cardiovascular: Negative for chest pain, palpitations and leg swelling.  Gastrointestinal: Negative for nausea, vomiting, abdominal pain, diarrhea and blood in stool.  Genitourinary: Negative for dysuria, urgency, frequency and difficulty urinating.  Musculoskeletal: Negative for myalgias and arthralgias.  Neurological: Negative for dizziness, weakness and numbness.  Hematological: Negative for adenopathy.  Psychiatric/Behavioral: Negative for behavioral problems, confusion and dysphoric mood.  All other systems reviewed and are  negative.       Objective:   Physical Exam  Constitutional: She is oriented to person, place, and time. She appears well-developed and well-nourished. No distress.  HENT:  Head: Normocephalic and atraumatic.  Mouth/Throat: No oropharyngeal exudate.  Eyes: Conjunctivae and EOM are normal. Pupils are equal, round, and reactive to light.  Neck: Normal range of motion. Neck supple.  Cardiovascular: Normal rate, regular rhythm and intact distal pulses.   No murmur heard. Pulmonary/Chest: Effort normal and breath sounds normal. She has no wheezes.  Abdominal: Soft. Bowel sounds are normal. She exhibits no distension and no mass. There is no tenderness. There is no rebound and no guarding.  Genitourinary: Vagina normal and uterus normal. No labial fusion. There is no rash or tenderness on the right labia. There is no rash or tenderness on the left labia. Cervix exhibits no motion tenderness, no discharge and no friability. Right adnexum displays no tenderness and no fullness. Left adnexum displays no tenderness and no fullness. No erythema, tenderness or bleeding around the vagina. No signs of injury around the vagina. No vaginal discharge found.  Musculoskeletal: Normal range of motion. She exhibits no edema and no tenderness.  Lymphadenopathy:    She has no cervical adenopathy.  Neurological: She is alert and oriented to person, place, and time. No cranial nerve deficit. Coordination normal.  Skin: Skin is warm and dry.       Patient with 2cm in diameter erythematous tender subcutaneous nodule in the epigastric region without fluctuance or drainage and several healing areas on patient's torso and lower extremities.  Psychiatric: She has a normal mood and affect. Her  behavior is normal. Judgment and thought content normal.          Assessment & Plan:

## 2010-06-21 ENCOUNTER — Other Ambulatory Visit: Payer: Self-pay | Admitting: Internal Medicine

## 2010-06-21 DIAGNOSIS — I10 Essential (primary) hypertension: Secondary | ICD-10-CM

## 2010-06-21 MED ORDER — LISINOPRIL 10 MG PO TABS: 20.0000 mg | ORAL_TABLET | Freq: Every day | ORAL | Status: DC

## 2010-07-05 ENCOUNTER — Other Ambulatory Visit: Payer: BC Managed Care – PPO

## 2010-07-05 DIAGNOSIS — I10 Essential (primary) hypertension: Secondary | ICD-10-CM

## 2010-07-05 LAB — BASIC METABOLIC PANEL
BUN: 16 mg/dL (ref 6–23)
Creat: 0.78 mg/dL (ref 0.40–1.20)
Glucose, Bld: 100 mg/dL — ABNORMAL HIGH (ref 70–99)
Potassium: 3.4 mEq/L — ABNORMAL LOW (ref 3.5–5.3)

## 2010-07-25 ENCOUNTER — Telehealth: Payer: Self-pay | Admitting: *Deleted

## 2010-07-25 NOTE — Telephone Encounter (Signed)
Pt presented stating dr told her to take 2 lisinopril daily to = 20 mg, when she went to pharm she was given only 30 10mg  lisinopril, looking back at chart, script was sent for lisnopril 10mg  take 2 daily, shall i tell the pharmacist to change to #60 w/ 5 refills?

## 2010-07-27 NOTE — Telephone Encounter (Signed)
Yes - please change the prescription to #60 with 5 refills!  Thank you!

## 2010-09-01 ENCOUNTER — Encounter: Payer: Self-pay | Admitting: Ophthalmology

## 2010-10-05 ENCOUNTER — Encounter: Payer: BC Managed Care – PPO | Admitting: Internal Medicine

## 2010-10-06 ENCOUNTER — Encounter: Payer: Self-pay | Admitting: Internal Medicine

## 2010-10-17 ENCOUNTER — Encounter: Payer: Self-pay | Admitting: Internal Medicine

## 2010-10-17 ENCOUNTER — Ambulatory Visit (INDEPENDENT_AMBULATORY_CARE_PROVIDER_SITE_OTHER): Payer: BC Managed Care – PPO | Admitting: Internal Medicine

## 2010-10-17 VITALS — BP 122/82 | HR 74 | Temp 95.4°F | Wt 214.9 lb

## 2010-10-17 DIAGNOSIS — I10 Essential (primary) hypertension: Secondary | ICD-10-CM

## 2010-10-17 DIAGNOSIS — I8390 Asymptomatic varicose veins of unspecified lower extremity: Secondary | ICD-10-CM

## 2010-10-17 DIAGNOSIS — R519 Headache, unspecified: Secondary | ICD-10-CM | POA: Insufficient documentation

## 2010-10-17 DIAGNOSIS — R51 Headache: Secondary | ICD-10-CM

## 2010-10-17 DIAGNOSIS — M129 Arthropathy, unspecified: Secondary | ICD-10-CM

## 2010-10-17 DIAGNOSIS — J358 Other chronic diseases of tonsils and adenoids: Secondary | ICD-10-CM

## 2010-10-17 MED ORDER — ACETAMINOPHEN 500 MG PO TABS: 1000.0000 mg | ORAL_TABLET | Freq: Four times a day (QID) | ORAL | Status: DC | PRN

## 2010-10-17 MED ORDER — MELOXICAM 15 MG PO TABS: 15.0000 mg | ORAL_TABLET | Freq: Every day | ORAL | Status: DC

## 2010-10-17 NOTE — Assessment & Plan Note (Signed)
I explained the benign nature of the patient's tonsilliths. She was reassured.

## 2010-10-17 NOTE — Assessment & Plan Note (Addendum)
Today we'll refer the patient to sports medicine clinic. I gave her detailed instructions on how to arrive at the clinic and we were able to provide her with an appointment before she left today. In the meantime I advised her to try ibuprofen or Mobic along with Tylenol in an alternating fashion to control her pain.  She could continue to use tramadol as needed for breakthrough pain - it appears that she is not using very much.  We will have her return for evaluation by [place] in 3 months

## 2010-10-17 NOTE — Assessment & Plan Note (Signed)
Today my nurse Deba will measure the patient for TED hose and now this to her house.

## 2010-10-17 NOTE — Assessment & Plan Note (Signed)
This may be rebound headaches from chronic pain medicine use. 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 I will try having her alternate her ibuprofen use with Tylenol to see if this helps. We will also try different NSAID Mobic today. Please see above problem. Fortunately she states these are not debilitating headaches and she is only mildly bothered by them.

## 2010-10-17 NOTE — Progress Notes (Signed)
  Subjective:    Patient ID: Allison Cook, female    DOB: 03/01/1962, 50 y.o.   MRN: [id number]  HPI Comments: Patient is a very pleasant Spanish-speaking 50 year old woman who presents for regular checkup. She states she's been compliant with her medicine. She complaints of some occasional slight left-sided headache which he says is worse when she is not take ibuprofen. She states she's been taking 800 mg of ibuprofen 3 times a day. She denies any pain of her jaw with chewing, vision changes, or fever. She states the pain comes and goes and is usually present in the late afternoon before she takes her evening dose of ibuprofen. She states she did try tramadol for pain only for breakthrough  Patient states she would like to have some TED hose as she's noticed increased varicosities in her lower legs and some pain associated with these with mild swelling.  No chest pain, shortness of breath, bowel problems, urinary symptoms.  Patient also complained of having occasional white stonelike bits come from the back of her throat which are foul smelling. She does not complain of any sore throat or difficulty swallowing.  Patient states she completed the course of doxycycline and she has no new skin infections     Review of Systems  Constitutional: Negative for fever, chills and appetite change.  HENT: Negative for hearing loss, ear pain, facial swelling, rhinorrhea, neck pain and neck stiffness.   Eyes: Negative for pain, discharge and visual disturbance.  Respiratory: Negative for cough, chest tightness and shortness of breath.   Cardiovascular: Negative for chest pain and palpitations.  Gastrointestinal: Negative for abdominal distention.  Musculoskeletal: Negative for joint swelling.  Neurological: Negative for dizziness and weakness.  Hematological: Negative for adenopathy.  Psychiatric/Behavioral: Negative for behavioral problems, dysphoric mood and decreased concentration.         Objective:   Physical Exam  Constitutional: She is oriented to person, place, and time. She appears well-developed and well-nourished. No distress.  HENT:  Head: Normocephalic and atraumatic.  Mouth/Throat: Oropharynx is clear and moist. No oropharyngeal exudate.       Scattered tonsiliths visualized on bilateral tonsils without any evidence of erythema or infection  Eyes: Conjunctivae and EOM are normal. Pupils are equal, round, and reactive to light.  Neck: Normal range of motion. Neck supple. No thyromegaly present.  Cardiovascular: Normal rate, regular rhythm and intact distal pulses.   No murmur heard. Pulmonary/Chest: Effort normal and breath sounds normal. She has no wheezes.  Abdominal: Soft. Bowel sounds are normal. She exhibits no distension and no mass. There is no tenderness. There is no rebound and no guarding.  Musculoskeletal: She exhibits no edema and no tenderness.       Mildly antalgic gait.   Lymphadenopathy:    She has no cervical adenopathy.  Neurological: She is alert and oriented to person, place, and time. No cranial nerve deficit. Coordination normal.  Skin: Skin is warm and dry. No rash noted.       Moderate amount of varicosities in the bilateral lower extremities  Well-healed scar from previous skin infection mid upper abdomen which is mildly tender to palpation but no signs of active infection  Psychiatric: She has a normal mood and affect. Her behavior is normal. Judgment and thought content normal.          Assessment & Plan:

## 2010-10-17 NOTE — Patient Instructions (Addendum)
Usted tendra una cita con los medicos de la medicina deportiva para Comptroller que tiene en los [place].   Puede usar tylenol 1000mg  cada 6 horas. Puede usar ibuprofen 600mg  cada 6 horas O mobic 15mg  cada 24 horas.  Se puede usar tylenol con ibuprofen O tylenol con mobic.  NO DEBE USAR mobic con ibuprofen en el mismo dia. Regrese a Event organiser en 3 meses. Vamos a Architect a su casa las medias especiales para los venos de las piernas. Para llegar a la clinical de Sports Medicine - caminar por [place] (hacia [place] - el hospital queda a su isquierda), al llegar a [facility] vayase a la isquierda y busca "[facility]" - el nombre de Event organiser. Su cita es el 19 de junio a las 3 (el marzo que viene).  [phone].

## 2010-10-17 NOTE — Assessment & Plan Note (Signed)
At goal today. I will not make any changes in her regimen at this time.

## 2010-10-18 NOTE — Progress Notes (Signed)
Knee hi TED mailed to pt per Dr Claudette Laws - length 16" and calf 16 3/4'. Stanton Kidney Justen Fonda RN 10/18/10 8:30AM

## 2010-10-23 ENCOUNTER — Ambulatory Visit (INDEPENDENT_AMBULATORY_CARE_PROVIDER_SITE_OTHER): Payer: BC Managed Care – PPO | Admitting: Internal Medicine

## 2010-10-23 ENCOUNTER — Telehealth: Payer: Self-pay | Admitting: *Deleted

## 2010-10-23 ENCOUNTER — Encounter: Payer: Self-pay | Admitting: Internal Medicine

## 2010-10-23 VITALS — BP 131/82 | HR 63 | Temp 97.4°F | Wt 213.0 lb

## 2010-10-23 DIAGNOSIS — L0291 Cutaneous abscess, unspecified: Secondary | ICD-10-CM | POA: Insufficient documentation

## 2010-10-23 MED ORDER — SULFAMETHOXAZOLE-TRIMETHOPRIM 800-160 MG PO TABS: 1.0000 | ORAL_TABLET | Freq: Two times a day (BID) | ORAL | Status: AC

## 2010-10-23 MED ORDER — CHLORHEXIDINE GLUCONATE 4 % EX LIQD: Freq: Every day | CUTANEOUS | Status: AC

## 2010-10-23 NOTE — Progress Notes (Signed)
  Subjective:    Patient ID: Allison Cook, female    DOB: 03/01/1962, 50 y.o.   MRN: [id number]  HPI Please see the A&P for the status of the pt's chronic medical problems.    Review of Systems     Objective:   Physical Exam VItal signs reviewed and stable. GEN: No apparent distress.  Alert and oriented x 3.  Pleasant, conversant, and cooperative to exam. HEENT: head is autraumatic and normocephalic.  Neck is supple without palpable masses or lymphadenopathy.  No JVD or carotid bruits.  Vision intact.  EOMI.  PERRLA.  Sclerae anicteric.  Conjunctivae without pallor or injection. Mucous membranes are moist.  Oropharynx is without erythema, exudates, or other abnormal lesions.  Dentition is fair.  An approximately 1 x 1 cm pustule is noted at the base of patient's right nare.  It is very tender to palpation.  Fluctuance and erythema are noted.   RESP:  Lungs are clear to ascultation bilaterally with good air movement.  No wheezes, ronchi, or rubs. CARDIOVASCULAR: regular rate, normal rhythm.  Clear S1, S2, no murmurs, gallops, or rubs. ABDOMEN: soft, non-tender, non-distended.  Bowels sounds present in all quadrants and normoactive.  No palpable masses. EXT: warm and dry.  Peripheral pulses equal, intact, and +2 globally.   SKIN: warm and dry with normal turgor.  There are no other abnormal lesions present on patient extremities or trunk.          Assessment & Plan:

## 2010-10-23 NOTE — Patient Instructions (Addendum)
If you develop fever, shaking chills, the area becomes more swollen or painful, schedule an appointment at the clinic. Use the hibiclens soap to wash your body and hair every day for 1-2 weeks. Bactrim as an antibiotic. Use as directed. Do not skip any pills and be sure to finish the full 10 days of treatment. Absceso/Divieso (Fornculo) (Abscess/Boil, Furuncle) Un absceso (divieso o fornculo) es una zona infectada que contiene pus. Puede ocurrir en la piel de cualquier parte del cuerpo. Puede ser doloroso, sensible, rojo y duro. Puede expandirse hacia otras partes del cuerpo. El Presenter, broadcasting un corte en el absceso. Luego se drena el pus. TRATAMIENTO En algunas ocasiones no es necesario drenarlos. El Presenter, broadcasting un corte en el absceso. De esta manera drenar el pus. Le colocarn una gasa. Podr ser necesaria la colocacin de un drenaje. El drenaje ayudar a que la herida se cure desde adentro. CUIDADOS EN EL HOGAR  Tome todos los medicamentos tal como se los prescribi el mdico.   Cuando se bae o duche, sumerja y luego remueva la venda que cubre el absceso. Lave la herida suavemente. Jabn suave y [place]. Vuelva a Information systems manager venda segn le haya indicado el mdico.   Consulte con el profesional para Artist.  BUSQUE AYUDA DE INMEDIATO SI:  Aumenta el dolor, el enrojecimiento, o la Paramedic de la herida.   Drena lquido o sangra en la regin de la herida.   Tiene signos de infeccin que empeoran o se expanden. Esto incluye dolores musculares, escalofros, o fiebre.   Aparecen nuevos bultos en la piel.   Usted o su nio tienen una temperatura oral de ms de 101 y no puede controlarla con medicamentos.   Su beb tiene ms de 3 meses y su temperatura rectal es de 102 F (38.9 C) o ms.   Su beb tiene 3 meses o menos y su temperatura rectal es de 100.4 F (38 C) o ms.  ASEGRESE DE QUE:    Comprende esas instrucciones para el  alta mdica.   Controlar su enfermedad.   Pedir ayuda de inmediato si no mejora o empeora.  Document Released: 07/20/2008 Document Re-Released: 10/11/2009 [facility] Patient Information 2011 [place], [place].

## 2010-10-23 NOTE — Telephone Encounter (Signed)
Pt states thru her family member that interprets for her that starting last tues a "bump" came up at her R nares it is very swollen, red and appears to have a pustule center, pt c/o pain to the area. Has used bactrim oint with no relief. Touching aggravates. States she has had similar type areas on her abd but not recently. She is added to dr mills at [employer] [facility]

## 2010-10-23 NOTE — Telephone Encounter (Signed)
agree

## 2010-10-24 ENCOUNTER — Encounter: Payer: Self-pay | Admitting: Family Medicine

## 2010-10-24 ENCOUNTER — Ambulatory Visit (INDEPENDENT_AMBULATORY_CARE_PROVIDER_SITE_OTHER): Payer: BC Managed Care – PPO | Admitting: Family Medicine

## 2010-10-24 VITALS — BP 121/80 | Ht 65.0 in | Wt 213.0 lb

## 2010-10-24 DIAGNOSIS — M25569 Pain in unspecified knee: Secondary | ICD-10-CM

## 2010-10-24 DIAGNOSIS — G5700 Lesion of sciatic nerve, unspecified lower limb: Secondary | ICD-10-CM | POA: Insufficient documentation

## 2010-10-24 MED ORDER — AMITRIPTYLINE HCL 25 MG PO TABS: 25.0000 mg | ORAL_TABLET | Freq: Every day | ORAL | Status: DC

## 2010-10-24 NOTE — Progress Notes (Signed)
  Subjective:    Patient ID: Allison Cook, female    DOB: 03/01/1962, 50 y.o.   MRN: [id number]  HPI 1.  RIGHT hip pain:  Present for past 2 years.  No inciting factor, progressing in intensity.  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  Pain mostly in Right buttock.  Describes as dull ache, 8/10 in intensity at its worse.  Now with paresthesias radiating through Right thigh to Right calf.  Taking Advil for pain.  [facility] and has been put on Gabapentin and Meloxicam.  Has not yet started to take Meloxicam. Denies any real weakness in leg, no numbness, only paresthesias.    2.  Bilateral knee pain:  Started about same time as hip pain.  Right knee worse than left.  Does complain of some locking at times in Right knee, never in Left.  Using Advil for this as well, provides some relief but not much.  Has not tried ice or heat.  Pain also worse at end of day after working all day.  No edema, erythema.    Review of Systems See HPI above for review of systems.       Objective:   Physical Exam Gen:  Alert, cooperative patient who appears stated age in no acute distress.  Vital signs reviewed. MSK:         Assessment & Plan:

## 2010-10-24 NOTE — Patient Instructions (Addendum)
1) You have right hip and knee pain due to piriformis syndrome & irritation of your sciatic nerve. 2) Stop taking the tramadol since this makes you sick. 3) Start taking the Mobic (meloxicam) that your doctor prescribed - do not take with ibuprofen, advil, motrin, or aleve.  Is ok to take tylenol with this. 4) Start taking Amitriptyline 25mg  at bedtime - this is to help with your pain & muscle tightness. 5) Do exercises and stretches as shown in the office.  Do these at least once a day. 6) Follow-up in 4-6 weeks to assess your progress. - Spanish interpretation below provided by interpretor.  1. Usted tinene dolor de la rodilla y la cadera porque usted tinene sydroma piriformis y irritacion del niervio sciatico 2. No vaya tomar tramadol porque le hace dano 3.Empiese  tomar su medicamento ( meloxicam) que su doctor la receto - no  vaya tomar ibuprofen, advil, motrin or aleve. Pero si puede tomar tylenol.  4. Empiese de tomar Amitri[tuline 25mg  cuando se va ir a dormir- esto le debe de ayudar con el dolor y para que no se siente apretada 5.Haga los ejercicios y estrirese como le ensenyamos el la oficina. Haga una ves por dia 6. Regrese en 4-6 semanas para ver su progreso.

## 2010-10-25 DIAGNOSIS — M25569 Pain in unspecified knee: Secondary | ICD-10-CM | POA: Insufficient documentation

## 2010-10-25 NOTE — Assessment & Plan Note (Addendum)
Right hip pain with associated numbness & tingling consistent with piriformis syndrome and irritation of sciatic nerve - She should start mobic as prescribed by PCP - Since she is not taking gabapentin & she is unsure as to why it was prescribed, will start her on Amitriptyline 25mg  q hs - Shown several lower extremity stretches for piriformis & hamstring to improve flexibility - Shown several hip exercises to help improve her strength - Follow-up in 4-6 weeks for re-evaluation - All instructions were reviewed with patient using Spanish Interpretor

## 2010-10-25 NOTE — Assessment & Plan Note (Addendum)
Bilateral knee pain  - Previous x-rays of Rt knee from 2010 show mild arthritis, although physical exam revealed no significant findings today.  Suspect knee pain is compensatory and related to altered gait mechanics from her right hip pain. - Meloxicam should help with this pain - Will reassess at follow-up appointment - All instructions were reviewed with patient using Spanish Interpretor

## 2010-10-25 NOTE — Progress Notes (Signed)
  Subjective:    Patient ID: Allison Cook, female    DOB: 03/01/1962, 50 y.o.   MRN: [id number]  HPI 50yo spanish-speaking female to office for evaluation of right hip pain & knee pain.  Entire visit conducted with assistance of Spanish Interpretor.  1.  RIGHT hip pain:  Present for past 2 years.  No inciting factor, progressing in intensity.  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  Pain mostly in Right buttock & right lateral thing, but does radiate down posterior aspect of her leg.  Describes as dull ache, 8/10 in intensity at its worse.  Now with numbness/tingling radiating through Right thigh to Right calf, does not go to the foot.  Taking Advil for pain.  Has seen [facility] and has been put on Meloxicam - which she has not yet started.  Gabapentin is listed in her current medications, but pt is not taking at this time and is unsure why she was to be taking medication.  Denies any real weakness in leg,denies changes in bowel or bladder, denies fevers or chills.  Denies any back pain.  No hx of imaging done on the hip.  2.  Bilateral knee pain:  Knee pain started about 2-years ago around same time as hip pain.  She denies any injury or trauma.  Pain currently worse in the right than the left.  Knees feels stiff, denies any locking, catching, or instability.  Denies any swelling.  Had been using advil with some relief, recently given Rx for Meloxicam, but has not yet started.  Has not tried ice or heat.  Pain also worse at end of day after working all day.  No edema, erythema.  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  No previous imaging on the left knee.  PMH, Allergies, Medications, Social hx reviewed in the electronic record.  Review of Systems See HPI above for review of systems.      Objective:   Physical Exam  Gen:  50yo Spanish-speaking female, Alert, cooperative patient who appears stated age in no acute  distress.  Vital signs reviewed. SKIN: no rashes/lesions RESP: respirations 15 and unlabored MSK:   - BACK: Lumbar spine with FROM, increased right sided hip pain with extension.  No mid-line tenderness, mild TTP along Rt SI joint & along piriformis & glut medius.  Neg SLR bilaterally, FABER slightly tight on the right, good SI joint mobility.  Able to toe walk & heel walk. - Hips: Bilateral hips with FROM, mild lateral hip pain on right with internal rotation.  Neg log roll.  Right hip mildly TTP over greater troch & along piriformis & glut medius as noted above.  Hip strength +4/5 with abduction & adduction on the right, +4/5 adduction on the left, otherwise +5/5. - Knees: FROM bilaterally without pain.  No swelling or effusion.  No joint line tenderness.  Neg McMurrary.  No ligamentous laxity.  Good quad & hamstring strength.  Neg patellar apprehension, neg patellar grind.  Slight genu valgum with standing - Gait: no leg length difference, slightly antalgic gait favoring right side NEURO: sensation intact to light touch, DTR +2/4 achilles & patella bilaterally VASC: pulses +2/4 DP & PT bilaterally      Assessment & Plan:

## 2010-10-27 LAB — WOUND CULTURE
Gram Stain: NONE SEEN
Gram Stain: NONE SEEN

## 2010-10-30 NOTE — Assessment & Plan Note (Signed)
An interpreter was present with the patient and provides the we'll translation for history of present illness.  Patient reports the presence of a skin lesion under her right nostril that first appeared last Thursday.    She states the lesion is very painful; she describes the pain as "like a burn" and is constant.  The pain is worse with touch.    She has had similar lesions on her abdomen, and has had 2 of these drained in the past.  She reports blood and purulent drainage that is expressed with pressure.  She denies fever, rigors, nausea, vomiting, or diarrhea.   She has no allergies to any antibiotics.    She is taking 4 advil every 8 hours with improvement of her pain but she notes that the pain recurs as soon as the medication wears off.    Her findings are consistent with a boil.  We'll perform bedside I&D today and send a specimen for wound culture and Gram stain.  Will prescribe an empiric ten-day course of antibiotic therapy with Bactrim DS to cover staph and strep, including MRSA.   Patient is advised to return if her symptoms worsen or if she develops fever or rigors.  It is highly likely that she is colonized with the etiological agent.  Will therefore prescribe Hibiclens for decolonization.  She does advised to use Hibiclens once daily in the shower, including washing her hair, for one week.  Hopefully this will decrease the frequency of recurrent boils.  Will follow the results of her Gram stain and culture to ensure she has received appropriate therapy.

## 2010-11-19 ENCOUNTER — Encounter: Payer: Self-pay | Admitting: Internal Medicine

## 2010-11-27 ENCOUNTER — Other Ambulatory Visit: Payer: Self-pay | Admitting: *Deleted

## 2010-11-27 DIAGNOSIS — M129 Arthropathy, unspecified: Secondary | ICD-10-CM

## 2010-11-27 MED ORDER — MELOXICAM 15 MG PO TABS
15.0000 mg | ORAL_TABLET | Freq: Every day | ORAL | Status: DC
Start: 2010-11-27 — End: 2010-11-28

## 2010-11-28 ENCOUNTER — Other Ambulatory Visit: Payer: Self-pay | Admitting: Internal Medicine

## 2010-12-04 ENCOUNTER — Ambulatory Visit: Payer: BC Managed Care – PPO | Admitting: Family Medicine

## 2011-01-26 ENCOUNTER — Other Ambulatory Visit: Payer: Self-pay | Admitting: *Deleted

## 2011-01-26 DIAGNOSIS — I10 Essential (primary) hypertension: Secondary | ICD-10-CM

## 2011-01-26 MED ORDER — LISINOPRIL 10 MG PO TABS: 20.0000 mg | ORAL_TABLET | Freq: Every day | ORAL | Status: DC

## 2011-01-26 NOTE — Telephone Encounter (Signed)
Pt is scheduling is appointment for November.  Pt's bottle reads Lisinopril 10 mg take 2 tablets by mouth everyday.

## 2011-02-09 ENCOUNTER — Encounter: Payer: Self-pay | Admitting: *Deleted

## 2011-02-09 DIAGNOSIS — L0291 Cutaneous abscess, unspecified: Secondary | ICD-10-CM

## 2011-02-09 MED ORDER — SULFAMETHOXAZOLE-TRIMETHOPRIM 800-160 MG PO TABS: 1.0000 | ORAL_TABLET | Freq: Two times a day (BID) | ORAL | Status: AC

## 2011-02-09 NOTE — Progress Notes (Signed)
I used the telephone spanish  interpreter to relay information to pt. Pt instructed to get  meds  At [facility] and call back if abscess area is  not improved in 3 - 4 days. Pt voices understanding of instructions.

## 2011-02-09 NOTE — Progress Notes (Signed)
  Subjective:    Patient ID: Allison Cook, female    DOB: 03/01/1962, 50 y.o.   MRN: [id number]  HPI  New abscess on inner thigh. Hx recurrent abcesses. Tender, early infection. Last treated 6/18 with bactrim which was effective.  Review of Systems Per HPI    Objective:   Physical Exam  Nurse assessment.      Assessment & Plan:  Will give 10 days of Bactrim. If not improved in 3-4 days will return to clinic.

## 2011-02-09 NOTE — Progress Notes (Signed)
Pt walked into clinic with c/o abscess to left upper leg.  Onset 4 days ago. Area is slightly red and painful to touch, per patient. Hx : several abscesses to different parts of body treated with Bactrim DS  Please advise  Pt goes to [facility] on [address]

## 2011-03-22 ENCOUNTER — Encounter: Payer: Self-pay | Admitting: Internal Medicine

## 2011-03-22 ENCOUNTER — Ambulatory Visit (INDEPENDENT_AMBULATORY_CARE_PROVIDER_SITE_OTHER): Payer: BC Managed Care – PPO | Admitting: Internal Medicine

## 2011-03-22 VITALS — BP 121/78 | HR 70 | Temp 97.8°F | Ht 64.0 in | Wt 204.5 lb

## 2011-03-22 DIAGNOSIS — Z23 Encounter for immunization: Secondary | ICD-10-CM

## 2011-03-22 DIAGNOSIS — Z1211 Encounter for screening for malignant neoplasm of colon: Secondary | ICD-10-CM

## 2011-03-22 DIAGNOSIS — Z1231 Encounter for screening mammogram for malignant neoplasm of breast: Secondary | ICD-10-CM

## 2011-03-22 DIAGNOSIS — M79674 Pain in right toe(s): Secondary | ICD-10-CM | POA: Insufficient documentation

## 2011-03-22 DIAGNOSIS — I10 Essential (primary) hypertension: Secondary | ICD-10-CM

## 2011-03-22 DIAGNOSIS — M79609 Pain in unspecified limb: Secondary | ICD-10-CM

## 2011-03-22 LAB — LIPID PANEL
HDL: 58 mg/dL (ref >39)
LDL Cholesterol: 91 mg/dL (ref 0–99)
Total CHOL/HDL Ratio: 2.8 Ratio
VLDL: 13 mg/dL (ref 0–40)

## 2011-03-22 MED ORDER — MELOXICAM 15 MG PO TABS: 15.0000 mg | ORAL_TABLET | Freq: Every day | ORAL | Status: DC

## 2011-03-22 MED ORDER — HYDROCHLOROTHIAZIDE 25 MG PO TABS: 25.0000 mg | ORAL_TABLET | Freq: Every day | ORAL | Status: DC

## 2011-03-22 MED ORDER — LISINOPRIL 20 MG PO TABS: 20.0000 mg | ORAL_TABLET | Freq: Every day | ORAL | Status: DC

## 2011-03-22 MED ORDER — ANALGESIC BALM 15-15 % EX OINT: TOPICAL_OINTMENT | CUTANEOUS | Status: DC | PRN

## 2011-03-22 NOTE — Assessment & Plan Note (Signed)
The clinical manifestation is consistent with soft tissue injury after pt hit her right foot on her home furniture. -will instruct her to use OTC Bengay for pain. -instruct pt that it will slowly get better and come back to the clinic if worsening. -pt agrees with the plan.

## 2011-03-22 NOTE — Progress Notes (Signed)
  Subjective:    Patient ID: Allison Cook, female    DOB: [birth year], 50 y.o.   MRN: [id number]  HPI This is a 50 year old pleasant lady with PMH of HTN, GERD and Piriformis syndrome who presents to clinic for routine visit and medication refills. Patient speaks Spanish and understands a little [place]. A Spanish interpreter is present with patient during this visit.  Patient states that she's been doing fine and very compliant with her medications for her blood pressure and Piriformis syndrome. Patient reports that she accidentally hit her right foot on her home furniture 3 weeks ago when she noticed pain, swelling and bruising on right her toes and half way through the back her foot. Patient did not seek any medical attention. Patient states her pain, swelling and bruising gradually getting better and subsided except for small area of right 5th toe. She's able to ambulate well without any pain except that she feels mild pain on her right fifth toe when she put all her body weight on lateral side of right foot. In general, above accident did not affect her ability of walking and ADLs.    Review of Systems  No headache, fever, or sore throat. No shortness of breath or dyspnea on exertion. No chest pain, chest pressure or palpitation.  No nausea, vomiting, or abdominal pain. No melena, diarrhea or incontinence.  No muscle weakness.  Denies depression. No appetite or weight changes.   Objective:   Physical Exam  Musculoskeletal:       Feet:   General: alert, well-developed, and cooperative to examination.  Head: normocephalic and atraumatic.  Eyes: vision grossly intact, pupils equal, pupils round, pupils reactive to light, no injection and anicteric.  Mouth: pharynx pink and moist, no erythema, and no exudates.  Neck: supple, full ROM, no thyromegaly, no JVD, and no carotid bruits.  Lungs: normal respiratory effort, no accessory muscle use, normal breath sounds, no crackles, and no  wheezes. Heart: normal rate, regular rhythm, no murmur, no gallop, and no rub.  Abdomen: soft, non-tender, normal bowel sounds, no distention, no guarding, no rebound tenderness, no hepatomegaly, and no splenomegaly.  Msk: no joint swelling, no joint warmth, and no redness over joints.  Pulses: 2+ DP/PT pulses bilaterally Extremities: No cyanosis, or clubbing. Right 5 th toe slight swelling and old bruising noted. Full ROM.  Mild tenderness noted when applying significant pressure on lateral part of her right 5 th metatarsophaleangeal joint.  No bony deformities noted. Bilateral feet symmetrical. Pt is able to put pressure on her and ambulate well.  Small area of healing skin scar noted on the medial side of her right 5th toe. Neurologic: alert & oriented X3, cranial nerves II-XII intact, strength normal in all extremities, sensation intact to light touch, and gait normal.  Skin: turgor normal and no rashes.  Psych: Oriented X3, memory intact for recent and remote, normally interactive, good eye contact, not anxious appearing, and not depressed appearing.      Assessment & Plan:

## 2011-03-22 NOTE — Patient Instructions (Addendum)
1. Follow up with me in 6 months 2. Your right 5th toe area will continue to get better. Please come back if worsening.

## 2011-03-22 NOTE — Assessment & Plan Note (Signed)
Flu shot given today  Pt tolerated well

## 2011-03-22 NOTE — Progress Notes (Signed)
I discussed Ms Contreas with Dr Dierdre Searles and have reviewed and agree with her note. Her foot injury could be c/w 5th metatarsal head fx but imaging will not change the conservative treatment.

## 2011-04-10 NOTE — Progress Notes (Signed)
Addended by: Neomia Dear on: 04/10/2011 02:51 PM   Modules accepted: Orders

## 2011-04-20 ENCOUNTER — Other Ambulatory Visit: Payer: BC Managed Care – PPO | Admitting: Internal Medicine

## 2011-04-24 ENCOUNTER — Ambulatory Visit (HOSPITAL_COMMUNITY): Payer: BC Managed Care – PPO | Attending: Internal Medicine

## 2011-06-22 ENCOUNTER — Other Ambulatory Visit: Payer: Self-pay | Admitting: Internal Medicine

## 2012-01-03 ENCOUNTER — Ambulatory Visit (INDEPENDENT_AMBULATORY_CARE_PROVIDER_SITE_OTHER): Payer: 59 | Admitting: Internal Medicine

## 2012-01-03 VITALS — BP 109/68 | HR 60 | Temp 97.8°F | Ht 64.0 in | Wt 209.6 lb

## 2012-01-03 DIAGNOSIS — G5700 Lesion of sciatic nerve, unspecified lower limb: Secondary | ICD-10-CM

## 2012-01-03 DIAGNOSIS — Z0000 Encounter for general adult medical examination without abnormal findings: Secondary | ICD-10-CM

## 2012-01-03 DIAGNOSIS — K219 Gastro-esophageal reflux disease without esophagitis: Secondary | ICD-10-CM

## 2012-01-03 DIAGNOSIS — I10 Essential (primary) hypertension: Secondary | ICD-10-CM

## 2012-01-03 LAB — LIPID PANEL
HDL: 52 mg/dL (ref >39)
LDL Cholesterol: 88 mg/dL (ref 0–99)
Triglycerides: 129 mg/dL (ref <150)
VLDL: 26 mg/dL (ref 0–40)

## 2012-01-03 LAB — CBC
HCT: 37.3 % (ref 36.0–46.0)
Hemoglobin: 13.4 g/dL (ref 12.0–15.0)
MCHC: 35.9 g/dL (ref 30.0–36.0)
WBC: 7.1 10*3/uL (ref 4.0–10.5)

## 2012-01-03 MED ORDER — LISINOPRIL 20 MG PO TABS: 20.0000 mg | ORAL_TABLET | Freq: Every day | ORAL | Status: DC

## 2012-01-03 MED ORDER — HYDROCHLOROTHIAZIDE 25 MG PO TABS: 25.0000 mg | ORAL_TABLET | Freq: Every day | ORAL | Status: DC

## 2012-01-03 NOTE — Assessment & Plan Note (Signed)
Well controlled and noncompliant with medical treatment.  - Continue current regimen -Refill sent - check CMP

## 2012-01-03 NOTE — Assessment & Plan Note (Signed)
Well-controlled with Tylenol C. hormone by mouth once a day as needed  - Continue current regimen he

## 2012-01-03 NOTE — Patient Instructions (Addendum)
1. Follow  up in 3 months 2. Will preform Pap smear during next visit. Please schedule a long visit  Dieta para reflujo gastroesofgico o lcera pptica (Diet for GERD or PUD) n cambio en los hbitos nutricionales puede ayudar a Paramedic las molestias del reflujo gastroesofgico y de la lcera pptica.  INSTRUCCIONES PARA EL CUIDADO DOMICILIARIO  Coma lentamente, en un clima distendido.   Coma 5  6 comidas pequeas por da.   Si algn alimento le produce malestar, elimnelo durante algn tiempo.  EVITAR LOS SIGUIENTES ALIMENTOS  Caf, comn o descafeinado.   Bebidas cola, normales o de bajas caloras.   T, comn o descafeinado.   Pimienta.   Cacao.   Alimentos ricos en grasa, inclusive carnes.   [place]anteca, margarina, aceite hidrogenado (grasas trans).   Menta o menta verde (si sufre de reflujo gastroesofgico).   Nils Pyle y verduras cuando haya intolerancia.   Bebidas alcohlicas.   Nicotina (en cigarrillos o para Theatre manager), ya que este es uno de los estimulantes ms potentes en la produccin de cido en el tracto gastrointestinal.   Todo alimento que agrave el trastorno.  Si tiene dudas relacionadas con la dieta, comunquese con el profesional que lo asiste o con un nutricionista matriculado. CONSEJOS  Si se acuesta los sntomas pueden empeorar. Mantenga la cabecera de la cama levantada entre 15 y 23 cm usando una cua de espuma de goma o colocando bloques debajo de las patas de la cama.   No se acueste hasta despus de 3 horas de haber comido.   La actividad fsica diaria puede reducir los sntomas.  EST SEGURO QUE:   Comprende las instrucciones para el alta mdica.   Controlar su enfermedad.   Solicitar atencin mdica de inmediato segn las indicaciones.  Document Released: 01/31/2005 Document Revised: 04/12/2011 [facility] Patient Information 2012 [place], [place].

## 2012-01-03 NOTE — Progress Notes (Signed)
Subjective:   Patient ID: Allison Cook female   DOB: [birth year] 50 y.o.   MRN: [id number]  HPI: Allison Cook is a 51 y.o. pleasant lady with PMH of HTN, GERD and Piriformis syndrome who presents to clinic for routine visit and medication refills. Patient speaks Spanish and understands a little [place]. A Spanish interpreter is present with patient during this visit.  Patient states that she's been doing fine and very compliant with her medications for her blood pressure and Piriformis syndrome. She brought her medication bottles with her as well  Health maintenance 1. Will check CMP, CBC, TSH, Lipid panel and UA  2. Colonoscopy: declined for now. Would like to discuss with me in next visit 3. Mammogram: referral sent 4. Pap smear: would like to do it next OV.     Past Medical History  Diagnosis Date  . GERD (gastroesophageal reflux disease)   . Hypertension   . Irregular menstrual bleeding   . Anemia, iron deficiency     This has resolved  . Otitis externa   . Arthritis     Generalized, rx with nsaids.  . Varicose veins   . Systolic murmur 8/08    There is vigorous LV function. With this, there is slight increase in the gradient across the pulmonic valve.  . Allergic rhinitis    Current Outpatient Prescriptions  Medication Sig Dispense Refill  . acetaminophen (TYLENOL) 325 MG tablet Take 650 mg by mouth daily as needed.      Marland Kitchen aspirin (BUFFERIN LOW DOSE) 81 MG EC tablet Take 81 mg by mouth daily.        . hydrochlorothiazide (HYDRODIURIL) 25 MG tablet Take 1 tablet (25 mg total) by mouth daily.  90 tablet  4  . lisinopril (PRINIVIL,ZESTRIL) 20 MG tablet Take 1 tablet (20 mg total) by mouth daily.  30 tablet  11  . DISCONTD: hydrochlorothiazide (HYDRODIURIL) 25 MG tablet Take 1 tablet (25 mg total) by mouth daily.  90 tablet  4  . DISCONTD: lisinopril (PRINIVIL,ZESTRIL) 20 MG tablet Take 1 tablet (20 mg total) by mouth daily.  30 tablet  11  . DISCONTD: lisinopril  (PRINIVIL,ZESTRIL) 10 MG tablet TAKE TWO TABLETS BY MOUTH EVERY DAY  60 tablet  4   Family History  Problem Relation Age of Onset  . Hypertension Mother    History   Social History  . Marital Status: Single    Spouse Name: N/A    Number of Children: 0  . Years of Education: N/A   Occupational History  . hotel housekeeper    Social History Main Topics  . Smoking status: Never Smoker   . Smokeless tobacco: Not on file  . Alcohol Use: No  . Drug Use: No  . Sexually Active: Not Currently   Other Topics Concern  . Not on file   Social History Narrative   Single immigrated from [place] in 1999. Lives at home by self. 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    Review of Systems: Review of Systems:  Constitutional:  Denies fever, chills, diaphoresis, appetite change and fatigue.   HEENT:  Denies congestion, sore throat, rhinorrhea, sneezing, mouth sores, trouble swallowing, neck pain   Respiratory:  Denies SOB, DOE, cough, and wheezing.   Cardiovascular:  Denies palpitations and leg swelling.   Gastrointestinal:  Denies nausea, vomiting, abdominal pain, diarrhea, constipation, blood in stool and abdominal distention.   Genitourinary:  Denies dysuria, urgency, frequency, hematuria, flank pain and difficulty urinating.   Musculoskeletal:  Denies myalgias, back pain, joint swelling, arthralgias and gait problem.   Skin:  Denies pallor, rash and wound.   Neurological:  Denies dizziness, seizures, syncope, weakness, light-headedness, numbness and headaches.    .   Objective:  Physical Exam: Filed Vitals:   01/03/12 1427  BP: 109/68  Pulse: 60  Temp: 97.8 F (36.6 C)  TempSrc: Oral  Height: 5\' 4"  (1.626 m)  Weight: 209 lb 9.6 oz (95.074 kg)  SpO2: 99%   General: alert, well-developed, and cooperative to examination.  Head: normocephalic and atraumatic.  Eyes: vision grossly intact, pupils equal, pupils round, pupils reactive to light, no injection and anicteric.  Mouth:  pharynx pink and moist, no erythema, and no exudates.  Neck: supple, full ROM, no thyromegaly, no JVD, and no carotid bruits.  Lungs: normal respiratory effort, no accessory muscle use, normal breath sounds, no crackles, and no wheezes. Heart: normal rate, regular rhythm, no murmur, no gallop, and no rub.  Abdomen: soft, non-tender, normal bowel sounds, no distention, no guarding, no rebound tenderness, no hepatomegaly, and no splenomegaly.  Msk: no joint swelling, no joint warmth, and no redness over joints.  Pulses: 2+ DP/PT pulses bilaterally Extremities: No cyanosis, clubbing, edema Neurologic: alert & oriented X3, cranial nerves II-XII intact, strength normal in all extremities, sensation intact to light touch, and gait normal.  Skin: turgor normal and no rashes.  Psych: Oriented X3, memory intact for recent and remote, normally interactive, good eye contact, not anxious appearing, and not depressed appearing.   Assessment & Plan:

## 2012-01-03 NOTE — Assessment & Plan Note (Signed)
States that she is asymptomatic for a long time and does not want to take PPI.  - will remove PPI off her list - instruction on GERD given.

## 2012-01-04 LAB — URINALYSIS, ROUTINE W REFLEX MICROSCOPIC
Glucose, UA: NEGATIVE mg/dL
Leukocytes, UA: NEGATIVE
Nitrite: NEGATIVE
Specific Gravity, Urine: 1.023 (ref 1.005–1.030)
pH: 7 (ref 5.0–8.0)

## 2012-01-04 LAB — COMPLETE METABOLIC PANEL WITH GFR
AST: 15 U/L (ref 0–37)
Alkaline Phosphatase: 65 U/L (ref 39–117)
GFR, Est Non African American: 70 mL/min
Glucose, Bld: 95 mg/dL (ref 70–99)
Sodium: 139 mEq/L (ref 135–145)
Total Bilirubin: 0.3 mg/dL (ref 0.3–1.2)
Total Protein: 6.6 g/dL (ref 6.0–8.3)

## 2012-04-07 ENCOUNTER — Encounter: Payer: Self-pay | Admitting: Internal Medicine

## 2012-04-07 DIAGNOSIS — N83202 Unspecified ovarian cyst, left side: Secondary | ICD-10-CM | POA: Insufficient documentation

## 2012-04-10 ENCOUNTER — Encounter: Payer: 59 | Admitting: Internal Medicine

## 2012-04-10 NOTE — Progress Notes (Signed)
This encounter was created in error - please disregard.

## 2012-04-10 NOTE — Progress Notes (Deleted)
Subjective:   Patient ID: Allison Cook female   DOB: March 23, 1961 51 y.o.   MRN: [id number]  HPI: [name] is a 51 y.o. pleasant lady with PMH of HTN, GERD and Piriformis syndrome who presents to clinic for routine visit and medication refills. Patient speaks Spanish and understands a little [place]. A Spanish interpreter is present with patient during this visit.  1. HTN 2. Left ovarian cyst    Incidental findings noted on abd CT in 2009. asymptomatic  Health maintenance 1. Influenza 2. Colonoscopy 3. Mammogram: referral sent      Past Medical History  Diagnosis Date  . GERD (gastroesophageal reflux disease)   . Hypertension   . Irregular menstrual bleeding   . Anemia, iron deficiency     This has resolved  . Otitis externa   . Arthritis     Generalized, rx with nsaids.  . Varicose veins   . Systolic murmur 8/08    There is vigorous LV function. With this, there is slight increase in the gradient across the pulmonic valve.  . Allergic rhinitis    Current Outpatient Prescriptions  Medication Sig Dispense Refill  . acetaminophen (TYLENOL) 325 MG tablet Take 650 mg by mouth daily as needed.      Marland Kitchen aspirin (BUFFERIN LOW DOSE) 81 MG EC tablet Take 81 mg by mouth daily.        . hydrochlorothiazide (HYDRODIURIL) 25 MG tablet Take 1 tablet (25 mg total) by mouth daily.  90 tablet  4  . lisinopril (PRINIVIL,ZESTRIL) 20 MG tablet Take 1 tablet (20 mg total) by mouth daily.  30 tablet  11   Family History  Problem Relation Age of Onset  . Hypertension Mother    History   Social History  . Marital Status: Single    Spouse Name: N/A    Number of Children: 0  . Years of Education: N/A   Occupational History  . hotel housekeeper    Social History Main Topics  . Smoking status: Never Smoker   . Smokeless tobacco: Not on file  . Alcohol Use: No  . Drug Use: No  . Sexually Active: Not Currently   Other Topics Concern  . Not on file   Social History Narrative    Single immigrated from [place] in 1999. Lives at home by self. 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    Review of Systems: Review of Systems:  Constitutional:  Denies fever, chills, diaphoresis, appetite change and fatigue.   HEENT:  Denies congestion, sore throat, rhinorrhea, sneezing, mouth sores, trouble swallowing, neck pain   Respiratory:  Denies SOB, DOE, cough, and wheezing.   Cardiovascular:  Denies palpitations and leg swelling.   Gastrointestinal:  Denies nausea, vomiting, abdominal pain, diarrhea, constipation, blood in stool and abdominal distention.   Genitourinary:  Denies dysuria, urgency, frequency, hematuria, flank pain and difficulty urinating.   Musculoskeletal:  Denies myalgias, back pain, joint swelling, arthralgias and gait problem.   Skin:  Denies pallor, rash and wound.   Neurological:  Denies dizziness, seizures, syncope, weakness, light-headedness, numbness and headaches.    .   Objective:  Physical Exam: There were no vitals filed for this visit. General: alert, well-developed, and cooperative to examination.  Head: normocephalic and atraumatic.  Eyes: vision grossly intact, pupils equal, pupils round, pupils reactive to light, no injection and anicteric.  Mouth: pharynx pink and moist, no erythema, and no exudates.  Neck: supple, full ROM, no thyromegaly, no JVD, and no carotid bruits.  Lungs: normal respiratory effort, no accessory muscle use, normal breath sounds, no crackles, and no wheezes. Heart: normal rate, regular rhythm, no murmur, no gallop, and no rub.  Abdomen: soft, non-tender, normal bowel sounds, no distention, no guarding, no rebound tenderness, no hepatomegaly, and no splenomegaly.  Msk: no joint swelling, no joint warmth, and no redness over joints.  Pulses: 2+ DP/PT pulses bilaterally Extremities: No cyanosis, clubbing, edema Neurologic: alert & oriented X3, cranial nerves II-XII intact, strength normal in all extremities, sensation intact  to light touch, and gait normal.  Skin: turgor normal and no rashes.  Psych: Oriented X3, memory intact for recent and remote, normally interactive, good eye contact, not anxious appearing, and not depressed appearing.   Assessment & Plan:

## 2012-10-30 ENCOUNTER — Encounter: Payer: Self-pay | Admitting: Internal Medicine

## 2012-10-30 ENCOUNTER — Ambulatory Visit (INDEPENDENT_AMBULATORY_CARE_PROVIDER_SITE_OTHER): Payer: 59 | Admitting: Internal Medicine

## 2012-10-30 VITALS — BP 104/68 | HR 60 | Temp 97.8°F | Ht 64.0 in | Wt 200.5 lb

## 2012-10-30 DIAGNOSIS — L739 Follicular disorder, unspecified: Secondary | ICD-10-CM

## 2012-10-30 DIAGNOSIS — L738 Other specified follicular disorders: Secondary | ICD-10-CM

## 2012-10-30 DIAGNOSIS — I10 Essential (primary) hypertension: Secondary | ICD-10-CM

## 2012-10-30 LAB — COMPLETE METABOLIC PANEL WITH GFR
AST: 15 U/L (ref 0–37)
Albumin: 4.3 g/dL (ref 3.5–5.2)
Alkaline Phosphatase: 60 U/L (ref 39–117)
BUN: 20 mg/dL (ref 6–23)
Calcium: 10.1 mg/dL (ref 8.4–10.5)
Chloride: 99 mEq/L (ref 96–112)
Glucose, Bld: 79 mg/dL (ref 70–99)
Potassium: 4.2 mEq/L (ref 3.5–5.3)
Sodium: 137 mEq/L (ref 135–145)
Total Protein: 7 g/dL (ref 6.0–8.3)

## 2012-10-30 LAB — CBC WITH DIFFERENTIAL/PLATELET
Basophils Absolute: 0 10*3/uL (ref 0.0–0.1)
HCT: 38.8 % (ref 36.0–46.0)
Hemoglobin: 13.4 g/dL (ref 12.0–15.0)
Lymphocytes Relative: 32 % (ref 12–46)
Monocytes Absolute: 0.4 10*3/uL (ref 0.1–1.0)
Monocytes Relative: 5 % (ref 3–12)
Neutro Abs: 4.9 10*3/uL (ref 1.7–7.7)
Neutrophils Relative %: 61 % (ref 43–77)
WBC: 8.1 10*3/uL (ref 4.0–10.5)

## 2012-10-30 LAB — LIPID PANEL
HDL: 54 mg/dL (ref >39)
LDL Cholesterol: 73 mg/dL (ref 0–99)
Triglycerides: 100 mg/dL (ref <150)
VLDL: 20 mg/dL (ref 0–40)

## 2012-10-30 MED ORDER — ASPIRIN 81 MG PO TBEC: 81.0000 mg | DELAYED_RELEASE_TABLET | Freq: Every day | ORAL | Status: DC

## 2012-10-30 MED ORDER — LISINOPRIL-HYDROCHLOROTHIAZIDE 20-25 MG PO TABS: 1.0000 | ORAL_TABLET | Freq: Every day | ORAL | Status: DC

## 2012-10-30 MED ORDER — MUPIROCIN 2 % EX OINT: TOPICAL_OINTMENT | Freq: Three times a day (TID) | CUTANEOUS | Status: DC

## 2012-10-30 NOTE — Assessment & Plan Note (Addendum)
The clinical manifestation is consistent with the Dx. No s/s systemic infection.  She has had similar but worse situation in the past and was treated with Hibiclens and Doxycycline.   - Education on Dx given to patient. - instructed her to use warm compress to her localized redness 2-3 times daily. - will treat with Mupirocin cream TID as needed - patient is instructed to call the clinic if her symptoms persist or worse, or she develops fever, chills or feeling sick.

## 2012-10-30 NOTE — Assessment & Plan Note (Signed)
BP Readings from Last 3 Encounters:  10/30/12 104/68  01/03/12 109/68  03/22/11 121/78    Lab Results  Component Value Date   Eluzer [place] 139 01/03/2012   K 3.8 01/03/2012   CREATININE 0.95 01/03/2012    Assessment: Blood pressure control: controlled Progress toward BP goal:  at goal Comments:   Plan: Medications:  continue current medications Educational resources provided:   Self management tools provided: home blood pressure logbook Other plans:   Well controlled BP.  Will combine her antihypertensive  Will send Lisinopril-HCTZ 20-25 1 tab daily Detailed discussion given to patient and she understands how to take her medications.

## 2012-10-30 NOTE — Progress Notes (Signed)
Subjective:   Patient ID: Allison Cook female   DOB: [birth year] 52 y.o.   MRN: [id number]  Chief complaints: skin redness and follow up on HTN HPI: [name] is a 52 y.o. pleasant lady with PMH of HTN, GERD and Piriformis syndrome who presents to clinic for routine visit and medication refills. Patient speaks Spanish and understands a little [place]. A Spanish interpreter is present with patient during this visit.  # Folliculitis  Patient reports mild skin redness and pain on her suprapubic area for past 1-2 weeks. She reports that she does not shave her suprapubic area. Denies localized drainage. Denies fever, chill. She states that she feel well otherwise.   # HTN Patient states that she's been doing fine and very compliant with her medications for her blood pressure.   Health maintenance 1. Will check CMP, CBC, Lipid panel.  2. Colonoscopy: declined for now. Understand the risk of colon cancer. Would like to discuss with me in next visit 3. Mammogram: referral sent 4. Pap smear: would like to do it next OV.     Past Medical History  Diagnosis Date  . GERD (gastroesophageal reflux disease)   . Hypertension   . Irregular menstrual bleeding   . Anemia, iron deficiency     This has resolved  . Otitis externa   . Arthritis     Generalized, rx with nsaids.  . Varicose veins   . Systolic murmur 8/08    There is vigorous LV function. With this, there is slight increase in the gradient across the pulmonic valve.  . Allergic rhinitis    Current Outpatient Prescriptions  Medication Sig Dispense Refill  . aspirin (BUFFERIN LOW DOSE) 81 MG EC tablet Take 81 mg by mouth daily.        . hydrochlorothiazide (HYDRODIURIL) 25 MG tablet Take 1 tablet (25 mg total) by mouth daily.  90 tablet  4  . lisinopril (PRINIVIL,ZESTRIL) 20 MG tablet Take 1 tablet (20 mg total) by mouth daily.  30 tablet  11   No current facility-administered medications for this visit.   Family History   Problem Relation Age of Onset  . Hypertension Mother    History   Social History  . Marital Status: Single    Spouse Name: N/A    Number of Children: 0  . Years of Education: N/A   Occupational History  . hotel housekeeper    Social History Main Topics  . Smoking status: Never Smoker   . Smokeless tobacco: None  . Alcohol Use: No  . Drug Use: No  . Sexually Active: Not Currently   Other Topics Concern  . None   Social History Narrative   Single immigrated from [place] in 1999. Lives at home by self. 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    Review of Systems: Review of Systems:  Constitutional:  Denies fever, chills, diaphoresis, appetite change and fatigue.   HEENT:  Denies congestion, sore throat, rhinorrhea, sneezing, mouth sores, trouble swallowing, neck pain   Respiratory:  Denies SOB, DOE, cough, and wheezing.   Cardiovascular:  Denies palpitations and leg swelling.   Gastrointestinal:  Denies nausea, vomiting, abdominal pain, diarrhea, constipation, blood in stool and abdominal distention.   Genitourinary:  Denies dysuria, urgency, frequency, hematuria, flank pain and difficulty urinating.   Musculoskeletal:  Denies myalgias, back pain, joint swelling, arthralgias and gait problem.   Skin:  Denies pallor, rash and wound. Redness and pain on suprapubic area.   Neurological:  Denies dizziness,  seizures, syncope, weakness, light-headedness, numbness and headaches.    .   Objective:  Physical Exam: Filed Vitals:   10/30/12 1427  BP: 104/68  Pulse: 60  Temp: 97.8 F (36.6 C)  TempSrc: Oral  Height: 5\' 4"  (1.626 m)  Weight: 200 lb 8 oz (90.946 kg)  SpO2: 99%   General: alert, well-developed, and cooperative to examination.  Head: normocephalic and atraumatic.  Eyes: vision grossly intact, pupils equal, pupils round, pupils reactive to light, no injection and anicteric.  Mouth: pharynx pink and moist, no erythema, and no exudates.  Neck: supple, full ROM, no  thyromegaly, no JVD, and no carotid bruits.  Lungs: normal respiratory effort, no accessory muscle use, normal breath sounds, no crackles, and no wheezes. Heart: normal rate, regular rhythm, no murmur, no gallop, and no rub.  Abdomen: soft, non-tender, normal bowel sounds, no distention, no guarding, no rebound tenderness, no hepatomegaly, and no splenomegaly.  Msk: no joint swelling, no joint warmth, and no redness over joints.  Pulses: 2+ DP/PT pulses bilaterally Extremities: No cyanosis, clubbing, edema Neurologic: alert & oriented X3, cranial nerves II-XII intact, strength normal in all extremities, sensation intact to light touch, and gait normal.  Skin: turgor normal and no rashes. Mild redness and tenderness to palpation around hair follicles on suprapubic area. No induration or fluctuance. No swelling, warmth to touch. No Lymphoadenopathy.  Psych: Oriented X3, memory intact for recent and remote, normally interactive, good eye contact, not anxious appearing, and not depressed appearing.   Assessment & Plan:

## 2012-10-30 NOTE — Patient Instructions (Addendum)
1. Will check your blood work. 2. Will give your Mupirocin cream three times daily  for your infection.  3. Follow up in 3 months for pap smear.  4.  call the clinic if her symptoms persist or worse, or she develops fever, chills or feeling sick.

## 2012-11-04 NOTE — Progress Notes (Signed)
Case discussed with Dr. Li (at time of visit, soon after the resident saw the patient).  We reviewed the resident's history and exam and pertinent patient test results.  I agree with the assessment, diagnosis, and plan of care documented in the resident's note.  

## 2013-06-25 ENCOUNTER — Encounter: Payer: 59 | Admitting: Internal Medicine

## 2013-12-16 ENCOUNTER — Ambulatory Visit: Payer: 59 | Attending: Internal Medicine

## 2013-12-24 ENCOUNTER — Encounter: Payer: Self-pay | Admitting: Internal Medicine

## 2013-12-24 ENCOUNTER — Ambulatory Visit: Payer: Self-pay | Attending: Internal Medicine | Admitting: Internal Medicine

## 2013-12-24 VITALS — BP 127/82 | HR 67 | Temp 98.7°F | Resp 16 | Ht 63.0 in | Wt 205.8 lb

## 2013-12-24 DIAGNOSIS — Z8249 Family history of ischemic heart disease and other diseases of the circulatory system: Secondary | ICD-10-CM | POA: Insufficient documentation

## 2013-12-24 DIAGNOSIS — I8390 Asymptomatic varicose veins of unspecified lower extremity: Secondary | ICD-10-CM | POA: Insufficient documentation

## 2013-12-24 DIAGNOSIS — Z7982 Long term (current) use of aspirin: Secondary | ICD-10-CM | POA: Insufficient documentation

## 2013-12-24 DIAGNOSIS — I10 Essential (primary) hypertension: Secondary | ICD-10-CM | POA: Insufficient documentation

## 2013-12-24 DIAGNOSIS — N926 Irregular menstruation, unspecified: Secondary | ICD-10-CM | POA: Insufficient documentation

## 2013-12-24 DIAGNOSIS — R011 Cardiac murmur, unspecified: Secondary | ICD-10-CM | POA: Insufficient documentation

## 2013-12-24 DIAGNOSIS — J309 Allergic rhinitis, unspecified: Secondary | ICD-10-CM | POA: Insufficient documentation

## 2013-12-24 DIAGNOSIS — M129 Arthropathy, unspecified: Secondary | ICD-10-CM | POA: Insufficient documentation

## 2013-12-24 DIAGNOSIS — K219 Gastro-esophageal reflux disease without esophagitis: Secondary | ICD-10-CM | POA: Insufficient documentation

## 2013-12-24 LAB — CBC WITH DIFFERENTIAL/PLATELET
Basophils Absolute: 0 10*3/uL (ref 0.0–0.1)
Basophils Relative: 0 % (ref 0–1)
EOS ABS: 0.2 10*3/uL (ref 0.0–0.7)
EOS PCT: 3 % (ref 0–5)
HEMATOCRIT: 38.9 % (ref 36.0–46.0)
HEMOGLOBIN: 13.2 g/dL (ref 12.0–15.0)
LYMPHS ABS: 2.2 10*3/uL (ref 0.7–4.0)
Lymphocytes Relative: 31 % (ref 12–46)
MCH: 29.3 pg (ref 26.0–34.0)
MCHC: 33.9 g/dL (ref 30.0–36.0)
MCV: 86.3 fL (ref 78.0–100.0)
MONO ABS: 0.5 10*3/uL (ref 0.1–1.0)
MONOS PCT: 7 % (ref 3–12)
Neutro Abs: 4.1 10*3/uL (ref 1.7–7.7)
Neutrophils Relative %: 59 % (ref 43–77)
Platelets: 241 10*3/uL (ref 150–400)
RBC: 4.51 MIL/uL (ref 3.87–5.11)
RDW: 13.2 % (ref 11.5–15.5)
WBC: 7 10*3/uL (ref 4.0–10.5)

## 2013-12-24 LAB — POCT GLYCOSYLATED HEMOGLOBIN (HGB A1C): Hemoglobin A1C: 5.7

## 2013-12-24 MED ORDER — LISINOPRIL-HYDROCHLOROTHIAZIDE 20-25 MG PO TABS: 1.0000 | ORAL_TABLET | Freq: Every day | ORAL | Status: DC

## 2013-12-24 NOTE — Progress Notes (Signed)
Pt here to establish care for HTN with medication management Pt need refill on Lisinopril-HCTZ 20-25 mg tablet Denies blurry vision,dizziness or headache Pt needs pap smear but refuses today. Need Colonoscopy/mammogram screening

## 2013-12-24 NOTE — Patient Instructions (Signed)
Plan de alimentacin DASH (DASH Eating Plan) DASH es la sigla en ingls de "Enfoques Alimentarios para Detener la Hipertensin". El plan de alimentacin DASH ha demostrado bajar la presin arterial elevada (hipertensin). Los beneficios adicionales para la salud pueden incluir la disminucin del riesgo de diabetes mellitus tipo2, enfermedades cardacas e ictus. Este plan tambin puede ayudar a adelgazar. QU DEBO SABER ACERCA DEL PLAN DE ALIMENTACIN DASH? Para el plan de alimentacin DASH, seguir las siguientes pautas generales:  Elija los alimentos con un valor porcentual diario de sodio de menos del 5% (segn figura en la etiqueta del alimento).  Use hierbas o aderezos sin sal, en lugar de sal de mesa o sal marina.  Consulte al mdico o farmacutico antes de usar sustitutos de la sal.  Coma productos con bajo contenido de sodio, cuya etiqueta suele decir "bajo contenido de sodio" o "sin agregado de sal".  Coma alimentos frescos.  Coma ms verduras, frutas y productos lcteos con bajo contenido de grasas.  Elija los cereales integrales. Busque la palabra "integral" en el primer lugar de la lista de ingredientes.  Elija el pescado y el pollo o el pavo sin piel ms a menudo que las carnes rojas. Limite el consumo de pescado, carne de ave y carne a 6onzas (170g) por da.  Limite el consumo de dulces, postres, azcares y bebidas azucaradas.  Elija las grasas saludables para el corazn.  Limite el consumo de queso a 1onza (28g) por da.  Consuma ms comida casera y menos de restaurante, de buf y comida rpida.  Limite el consumo de alimentos fritos.  Cocine los alimentos utilizando mtodos que no sean la fritura.  Limite las verduras enlatadas. Si las consume, enjuguelas bien para disminuir el sodio.  Cuando coma en un restaurante, pida que preparen su comida con menos sal o, en lo posible, sin nada de sal. QU ALIMENTOS PUEDO COMER? Pida ayuda a un nutricionista para  conocer las necesidades calricas individuales. Cereales Pan de salvado o integral. Arroz integral. Pastas de salvado o integrales. Quinua, trigo burgol y cereales integrales. Cereales con bajo contenido de sodio. Tortillas de harina de maz o de salvado. Pan de maz integral. Galletas saladas integrales. Galletas con bajo contenido de sodio. Vegetales Verduras frescas o congeladas (crudas, al vapor, asadas o grilladas). Jugos de tomate y verduras con contenido bajo o reducido de sodio. Pasta y salsa de tomate con contenido bajo o reducido de sodio. Verduras enlatadas con bajo contenido de sodio o reducido de sodio.  Frutas Frutas frescas, en conserva (en su jugo natural) o frutas congeladas. Carnes y otros productos con protenas Carne de res molida (al 85% o ms magra), carne de res de animales alimentados con pastos o carne de res sin la grasa. Pollo o pavo sin piel. Carne de pollo o de pavo molida. Cerdo sin la grasa. Todos los pescados y frutos de mar. Huevos. Porotos, guisantes o lentejas secos. Frutos secos y semillas sin sal. Frijoles enlatados sin sal. Lcteos Productos lcteos con bajo contenido de grasas, como leche descremada o al 1%, quesos reducidos en grasas o al 2%, ricota con bajo contenido de grasas o queso cottage, o yogur natural con bajo contenido de grasas. Quesos con contenido bajo o reducido de sodio. Grasas y aceites Margarinas en barra que no contengan grasas trans. [place]onesa y alios para ensaladas livianos o reducidos en grasas (reducidos en sodio). [place]. Aceites de crtamo, oliva o canola. Mantequilla natural de man o almendra. Otros Palomitas de maz y pretzels sin sal.   Los artculos mencionados arriba pueden no ser una lista completa de las bebidas o los alimentos recomendados. Comunquese con el nutricionista para conocer ms opciones. QU ALIMENTOS NO SE RECOMIENDAN? Cereales Pan blanco. Pastas blancas. Arroz blanco. Pan de maz refinado. Bagels y  croissants. Galletas saladas que contengan grasas trans. Vegetales Vegetales con crema o fritos. Verduras en salsa de queso. Verduras enlatadas comunes. Pasta y salsa de tomate en lata comunes. Jugos comunes de tomate y de verduras. Frutas Frutas secas. Fruta enlatada en almbar liviano o espeso. Jugo de frutas. Carnes y otros productos con protenas Cortes de carne con grasa. Costillas, alas de pollo, tocineta, salchicha, mortadela, salame, chinchulines, tocino, perros calientes, salchichas alemanas y embutidos envasados. Frutos secos y semillas con sal. Frijoles con sal en lata. Lcteos Leche entera o al 2%, crema, mezcla de leche y crema, y queso crema. Yogur entero o endulzado. Quesos o queso azul con alto contenido de grasas. Cremas no lcteas y coberturas batidas. Quesos procesados, quesos para untar o cuajadas. Condimentos Sal de cebolla y ajo, sal condimentada, sal de mesa y sal marina. Salsas en lata y envasadas. Salsa [place]. Salsa trtara. Salsa barbacoa. Salsa teriyaki. Salsa de soja, incluso la que tiene contenido reducido de sodio. Salsa de carne. Salsa de pescado. Salsa de ostras. Salsa rosada. Rbano picante. Ketchup y mostaza. Saborizantes y tiernizantes para carne. Caldo en cubitos. Salsa picante. Salsa tabasco. Adobos. Aderezos para tacos. Salsas. Grasas y aceites Mantequilla, margarina en barra, manteca de cerdo, grasa, mantequilla clarificada y grasa de tocino. Aceites de coco, de palmiste o de palma. Aderezos comunes para ensalada. Otros Pickles y aceitunas. Palomitas de maz y pretzels con sal. Los artculos mencionados arriba pueden no ser una lista completa de las bebidas y los alimentos que se deben evitar. Comunquese con el nutricionista para obtener ms informacin. DNDE PUEDO ENCONTRAR MS INFORMACIN? Instituto Nacional del Corazn, del Pulmn y de la Sangre (National Heart, Lung, and Blood Institute):  www.nhlbi.nih.gov/health/health-topics/topics/dash/ Document Released: 04/12/2011 Document Revised: 09/07/2013 ExitCare Patient Information 2015 [employer]. This information is not intended to replace advice given to you by your health care provider. Make sure you discuss any questions you have with your health care provider. Hipertensin (Hypertension) La hipertensin, conocida comnmente como presin arterial alta, se produce cuando la sangre bombea en las arterias con mucha fuerza. Las arterias son los vasos sanguneos que transportan la sangre desde el corazn hacia todas las partes del cuerpo. Una lectura de la presin arterial consiste en un nmero ms alto sobre un nmero ms bajo, por ejemplo, 110/72. El nmero ms alto (presin sistlica) corresponde a la presin interna de las arterias cuando el corazn bombea sangre. El nmero ms bajo (presin diastlica) corresponde a la presin interna de las arterias cuando el corazn se relaja. En condiciones ideales, la presin arterial debe ser inferior a 120/80. La hipertensin fuerza al corazn a trabajar ms para bombear la sangre. Las arterias pueden estrecharse o ponerse rgidas. La hipertensin conlleva el riesgo de enfermedad cardaca, ictus y otros problemas.  FACTORES DE RIESGO Algunos factores de riesgo de hipertensin son controlables, pero otros no lo son.  Entre los factores de riesgo que usted no puede controlar, se incluyen:   La raza. El riesgo es mayor para las personas afroamericanas.  La edad. Los riesgos aumentan con la edad.  El sexo. Antes de los 45aos, los hombres corren ms riesgo que las mujeres. Despus de los 65aos, las mujeres corren ms riesgo que los hombres. Entre los factores de riesgo   que usted puede controlar, se incluyen:  No hacer la cantidad suficiente de actividad fsica o ejercicio.  Tener sobrepeso.  Consumir mucha grasa, azcar, caloras o sal en la dieta.  Beber alcohol en exceso. SIGNOS Y  SNTOMAS Por lo general, la hipertensin no causa signos o sntomas. La hipertensin demasiado alta (crisis hipertensiva) puede causar dolor de cabeza, ansiedad, falta de aire y hemorragia nasal. DIAGNSTICO  Para detectar si usted tiene hipertensin, el mdico le medir la presin arterial mientras est sentado, con el brazo levantado a la altura del corazn. Debe medirla al menos dos veces en el mismo brazo. Determinadas condiciones pueden causar una diferencia de presin arterial entre el brazo izquierdo y el derecho. El hecho de tener una sola lectura de la presin arterial ms alta que lo normal no significa que necesita un tratamiento. En el caso de tener una lectura de la presin arterial con un valor alto, pdale al mdico que la verifique nuevamente. TRATAMIENTO  El tratamiento de la hipertensin arterial incluye hacer cambios en el estilo de vida y, posiblemente, tomar medicamentos. Un estilo de vida saludable puede ayudar a bajar la presin arterial alta. Quiz deba cambiar algunos hbitos. Los cambios en el estilo de vida pueden incluir:  Seguir la dieta DASH. Esta dieta tiene un alto contenido de frutas, verduras y cereales integrales. Incluye poca cantidad de sal, carnes rojas y azcares agregados.  Hacer al menos 2horas de actividad fsica enrgica todas las semanas.  Perder peso, si es necesario.  No fumar.  Limitar el consumo de bebidas alcohlicas.  Aprender formas de reducir el estrs. Si los cambios en el estilo de vida no son suficientes para lograr controlar la presin arterial, el mdico puede recetarle medicamentos. Quiz necesite tomar ms de uno. Trabaje en conjunto con su mdico para comprender los riesgos y los beneficios. INSTRUCCIONES PARA EL CUIDADO EN EL HOGAR  Haga que le midan de nuevo la presin arterial segn las indicaciones del mdico.  Tome los medicamentos solamente como se lo haya indicado el mdico. Siga cuidadosamente las indicaciones. Los  medicamentos para la presin arterial deben tomarse segn las indicaciones. Los medicamentos pierden eficacia al omitir las dosis. El hecho de omitir las dosis tambin aumenta el riesgo de otros problemas.  No fume.  Contrlese la presin arterial en su casa segn las indicaciones del mdico. SOLICITE ATENCIN MDICA SI:   Piensa que tiene una reaccin alrgica a los medicamentos.  Tiene mareos o dolores de cabeza con recurrencia.  Tiene hinchazn en los tobillos.  Tiene problemas de visin. SOLICITE ATENCIN MDICA DE INMEDIATO SI:  Siente un dolor de cabeza intenso o confusin.  Siente debilidad inusual, adormecimiento o que se desmayar.  Siente dolor intenso en el pecho o en el abdomen.  Vomita repetidas veces.  Tiene dificultad para respirar. ASEGRESE DE QUE:   Comprende estas instrucciones.  Controlar su afeccin.  Recibir ayuda de inmediato si no mejora o si empeora. Document Released: 04/23/2005 Document Revised: 09/07/2013 ExitCare Patient Information 2015 [employer]. This information is not intended to replace advice given to you by your health care provider. Make sure you discuss any questions you have with your health care provider.  

## 2013-12-24 NOTE — Progress Notes (Signed)
Patient ID: Allison Cook, female   DOB: 03/01/1962, 53 y.o.   MRN: [id number]   Allison Cook, is a 53 y.o. female  [id number]  [id number]  DOB - 03/01/1962  CC:  Chief Complaint  Patient presents with  . Follow-up  . Hypertension  . Medication Refill       HPI: Allison Cook is a 53 y.o. female here today to establish medical care. Patient has history of hypertension, GERD, piriformis syndrome, and varicose veins here today with no significant complaint but to establish medical care. She recently lost her job and her insurance, since then she has not had a follow up as she has run out of her medications. She has declined most of her care maintenance including mammogram and colonoscopy in the past. She has not had Pap smear done in more than 3 years. Pt has been on lisinopril-hydrochlorothiazide 20-25 mg tablet by mouth daily for high blood pressure and needs refill. Denies blurry vision, dizziness or headache. Patient has No chest pain, No abdominal pain - No Nausea, No new weakness tingling or numbness, No Cough - SOB.  No Known Allergies Past Medical History  Diagnosis Date  . GERD (gastroesophageal reflux disease)   . Hypertension   . Irregular menstrual bleeding   . Anemia, iron deficiency     This has resolved  . Otitis externa   . Arthritis     Generalized, rx with nsaids.  . Varicose veins   . Systolic murmur 8/08    There is vigorous LV function. With this, there is slight increase in the gradient across the pulmonic valve.  . Allergic rhinitis    Current Outpatient Prescriptions on File Prior to Visit  Medication Sig Dispense Refill  . acetaminophen (TYLENOL) 650 MG suppository Place 650 mg rectally every 8 (eight) hours as needed for fever.      Marland Kitchen. aspirin (BUFFERIN LOW DOSE) 81 MG EC tablet Take 1 tablet (81 mg total) by mouth daily.  90 tablet  4  . mupirocin ointment (BACTROBAN) 2 % Apply topically 3 (three) times daily.  22 g  0    No current facility-administered medications on file prior to visit.   Family History  Problem Relation Age of Onset  . Hypertension Mother    History   Social History  . Marital Status: Single    Spouse Name: N/A    Number of Children: 0  . Years of Education: N/A   Occupational History  . hotel housekeeper    Social History Main Topics  . Smoking status: Never Smoker   . Smokeless tobacco: Not on file  . Alcohol Use: No  . Drug Use: No  . Sexual Activity: Not Currently   Other Topics Concern  . Not on file   Social History Narrative   Single immigrated from [place] in 1999. Lives at home by self. Works as a Advertising copywriterhousekeeper in a hotel.     Review of Systems: Constitutional: Negative for fever, chills, diaphoresis, activity change, appetite change and fatigue. HENT: Negative for ear pain, nosebleeds, congestion, facial swelling, rhinorrhea, neck pain, neck stiffness and ear discharge.  Eyes: Negative for pain, discharge, redness, itching and visual disturbance. Respiratory: Negative for cough, choking, chest tightness, shortness of breath, wheezing and stridor.  Cardiovascular: Negative for chest pain, palpitations and leg swelling. Gastrointestinal: Negative for abdominal distention. Genitourinary: Negative for dysuria, urgency, frequency, hematuria, flank pain, decreased urine volume, difficulty urinating and dyspareunia.  Musculoskeletal: Negative for back pain, joint swelling, arthralgia and  gait problem. Neurological: Negative for dizziness, tremors, seizures, syncope, facial asymmetry, speech difficulty, weakness, light-headedness, numbness and headaches.  Hematological: Negative for adenopathy. Does not bruise/bleed easily. Psychiatric/Behavioral: Negative for hallucinations, behavioral problems, confusion, dysphoric mood, decreased concentration and agitation.    Objective:   Filed Vitals:   12/24/13 1542  BP: 127/82  Pulse: 67  Temp: 98.7 F (37.1 C)   Resp: 16    Physical Exam: Constitutional: Patient appears well-developed and well-nourished. No distress. HENT: Normocephalic, atraumatic, External right and left ear normal. Oropharynx is clear and moist.  Eyes: Conjunctivae and EOM are normal. PERRLA, no scleral icterus. Neck: Normal ROM. Neck supple. No JVD. No tracheal deviation. No thyromegaly. CVS: RRR, S1/S2 +, no murmurs, no gallops, no carotid bruit.  Pulmonary: Effort and breath sounds normal, no stridor, rhonchi, wheezes, rales.  Abdominal: Soft. BS +, no distension, tenderness, rebound or guarding.  Musculoskeletal: Normal range of motion. No edema and no tenderness.  Lymphadenopathy: No lymphadenopathy noted, cervical, inguinal or axillary Neuro: Alert. Normal reflexes, muscle tone coordination. No cranial nerve deficit. Skin: Skin is warm and dry. No rash noted. Not diaphoretic. No erythema. No pallor. Psychiatric: Normal mood and affect. Behavior, judgment, thought content normal.  Lab Results  Component Value Date   WBC 8.1 10/30/2012   HGB 13.4 10/30/2012   HCT 38.8 10/30/2012   MCV 82.0 10/30/2012   PLT 215 10/30/2012   Lab Results  Component Value Date   CREATININE 0.73 10/30/2012   BUN 20 10/30/2012   NA 137 10/30/2012   K 4.2 10/30/2012   CL 99 10/30/2012   CO2 31 10/30/2012    No results found for this basename: HGBA1C   Lipid Panel     Component Value Date/Time   CHOL 147 10/30/2012 1515   TRIG 100 10/30/2012 1515   HDL 54 10/30/2012 1515   CHOLHDL 2.7 10/30/2012 1515   VLDL 20 10/30/2012 1515   LDLCALC 73 10/30/2012 1515       Assessment and plan:   1. Essential hypertension We have discussed blood pressure goal Patient is encouraged to be compliant with medications and more importantly diet and exercise  - lisinopril-hydrochlorothiazide (PRINZIDE,ZESTORETIC) 20-25 MG per tablet; Take 1 tablet by mouth daily.  Dispense: 90 tablet; Refill: 4 DASH diet  - MM DIGITAL SCREENING BILATERAL; Future - CBC  with Differential - COMPLETE METABOLIC PANEL WITH GFR - POCT glycosylated hemoglobin (Hb A1C) - Lipid panel - TSH - Urinalysis, Complete  Patient was counseled extensively on nutrition and exercise Patient refuses colonoscopy at this time, saying she would think about it against next appointment. Interpreter was used to communicate directly with patient for the entire encounter including providing detailed patient instructions.   Return in about 6 months (around 06/26/2014), or if symptoms worsen or fail to improve, for Follow up HTN, Pap Smear, Annual Physical.  The patient was given clear instructions to go to ER or return to medical center if symptoms don't improve, worsen or new problems develop. The patient verbalized understanding. The patient was told to call to get lab results if they haven't heard anything in the next week.     This note has been created with Education officer, environmental. Any transcriptional errors are unintentional.    Jeanann Lewandowsky, MD, MHA, FACP, FAAP [facility] [place], [place] [phone]   12/24/2013, 4:40 PM

## 2013-12-25 LAB — URINALYSIS, COMPLETE
BILIRUBIN URINE: NEGATIVE
Bacteria, UA: NONE SEEN
CRYSTALS: NONE SEEN
Casts: NONE SEEN
Glucose, UA: NEGATIVE mg/dL
Hgb urine dipstick: NEGATIVE
Ketones, ur: NEGATIVE mg/dL
LEUKOCYTES UA: NEGATIVE
Nitrite: NEGATIVE
PROTEIN: NEGATIVE mg/dL
SPECIFIC GRAVITY, URINE: 1.023 (ref 1.005–1.030)
SQUAMOUS EPITHELIAL / LPF: NONE SEEN
UROBILINOGEN UA: 0.2 mg/dL (ref 0.0–1.0)
pH: 6.5 (ref 5.0–8.0)

## 2013-12-25 LAB — COMPLETE METABOLIC PANEL WITH GFR
ALT: 19 U/L (ref 0–35)
AST: 16 U/L (ref 0–37)
Albumin: 4.5 g/dL (ref 3.5–5.2)
Alkaline Phosphatase: 77 U/L (ref 39–117)
BILIRUBIN TOTAL: 0.3 mg/dL (ref 0.2–1.2)
BUN: 20 mg/dL (ref 6–23)
CALCIUM: 9.6 mg/dL (ref 8.4–10.5)
CHLORIDE: 99 meq/L (ref 96–112)
CO2: 32 meq/L (ref 19–32)
CREATININE: 0.78 mg/dL (ref 0.50–1.10)
GFR, EST NON AFRICAN AMERICAN: 88 mL/min
GLUCOSE: 96 mg/dL (ref 70–99)
Potassium: 4 mEq/L (ref 3.5–5.3)
Sodium: 138 mEq/L (ref 135–145)
Total Protein: 6.8 g/dL (ref 6.0–8.3)

## 2013-12-25 LAB — LIPID PANEL
CHOLESTEROL: 173 mg/dL (ref 0–200)
HDL: 55 mg/dL (ref >39)
LDL CALC: 56 mg/dL (ref 0–99)
TRIGLYCERIDES: 310 mg/dL — AB (ref <150)
Total CHOL/HDL Ratio: 3.1 Ratio
VLDL: 62 mg/dL — ABNORMAL HIGH (ref 0–40)

## 2013-12-25 LAB — TSH: TSH: 1.566 u[IU]/mL (ref 0.350–4.500)

## 2013-12-28 ENCOUNTER — Telehealth: Payer: Self-pay | Admitting: Emergency Medicine

## 2013-12-28 NOTE — Telephone Encounter (Signed)
Pt given lab results with diet/exercise instructions to control low cholesterol diet with exercising regularly. Pt verbalized understanding per [place] interpretor, [place]

## 2013-12-28 NOTE — Telephone Encounter (Signed)
Message copied by Darlis Loan on Mon Dec 28, 2013  3:58 PM ------      Message from: Quentin Angst      Created: Fri Dec 25, 2013  9:36 AM       Please inform patient that her laboratory tests results are mostly within normal limits. She has no diabetes. Her triglyceride level is high, but her total cholesterol level is within normal limit. We will attempt nutritional control for now with low-cholesterol diet and low fat diet with regular physical exercise, we will test again in 6 months ------

## 2015-01-03 ENCOUNTER — Other Ambulatory Visit: Payer: Self-pay | Admitting: Internal Medicine

## 2015-01-03 NOTE — Telephone Encounter (Signed)
Patient is requesting a refill for lisinopril-hydrochlorothiazide (PRINZIDE,ZESTORETIC) 20-25 MG per tablet, Patient has not been seen in over a year. We have scheduled an appt.

## 2015-01-06 NOTE — Telephone Encounter (Signed)
Pt. Is following up in regards to prescription refill....please f/u

## 2015-01-17 ENCOUNTER — Encounter: Payer: Self-pay | Admitting: Internal Medicine

## 2015-01-17 ENCOUNTER — Ambulatory Visit: Payer: Self-pay | Attending: Internal Medicine | Admitting: Internal Medicine

## 2015-01-17 VITALS — BP 111/75 | HR 71 | Temp 98.9°F | Resp 16 | Ht 63.0 in | Wt 189.0 lb

## 2015-01-17 DIAGNOSIS — L03311 Cellulitis of abdominal wall: Secondary | ICD-10-CM | POA: Insufficient documentation

## 2015-01-17 DIAGNOSIS — Z79899 Other long term (current) drug therapy: Secondary | ICD-10-CM | POA: Insufficient documentation

## 2015-01-17 DIAGNOSIS — I10 Essential (primary) hypertension: Secondary | ICD-10-CM | POA: Insufficient documentation

## 2015-01-17 DIAGNOSIS — K219 Gastro-esophageal reflux disease without esophagitis: Secondary | ICD-10-CM | POA: Insufficient documentation

## 2015-01-17 DIAGNOSIS — I868 Varicose veins of other specified sites: Secondary | ICD-10-CM

## 2015-01-17 DIAGNOSIS — Z7982 Long term (current) use of aspirin: Secondary | ICD-10-CM | POA: Insufficient documentation

## 2015-01-17 DIAGNOSIS — L03319 Cellulitis of trunk, unspecified: Secondary | ICD-10-CM

## 2015-01-17 DIAGNOSIS — I8390 Asymptomatic varicose veins of unspecified lower extremity: Secondary | ICD-10-CM

## 2015-01-17 DIAGNOSIS — I8393 Asymptomatic varicose veins of bilateral lower extremities: Secondary | ICD-10-CM | POA: Insufficient documentation

## 2015-01-17 DIAGNOSIS — L02219 Cutaneous abscess of trunk, unspecified: Secondary | ICD-10-CM | POA: Insufficient documentation

## 2015-01-17 MED ORDER — LISINOPRIL-HYDROCHLOROTHIAZIDE 20-25 MG PO TABS: 1.0000 | ORAL_TABLET | Freq: Every day | ORAL | Status: DC

## 2015-01-17 NOTE — Progress Notes (Signed)
Establish Care with new pcp Complaining of blisters on abdominal area, no pain Hx HTN, not taking medication. Stated been taking bp medication every other day since last time was given only #6 No Hx tobaccom no Hx ETOH

## 2015-01-17 NOTE — Progress Notes (Signed)
Patient ID: Allison Cook, female   DOB: 03/01/1962, 54 y.o.   MRN: [id number]  CC: Hypertension, blisters   HPI: Allison Cook is a 54 y.o. female here today for a follow up visit.  Patient has past medical history of hypertension, GERD, and varicose veins here today with complaints of blisters on her abdomin. Patient reports that last week she noted she began to develop a blister on the right side of her abdomen that became red. Patient reports the area is actually healing and is only mildly tender to touch.  Patient reports she has been using Betadine on the area. The area is not warm to touch or draining.  Patient reports that she has been taking her blood pressure medications every other day because she had only 6 pills remaining. She took her last BP pill last night. She denies symptoms of headaches, chest pain, palpitations, edema, or blurred vision.    No Known Allergies Past Medical History  Diagnosis Date  . GERD (gastroesophageal reflux disease)   . Hypertension   . Irregular menstrual bleeding   . Anemia, iron deficiency     This has resolved  . Otitis externa   . Arthritis     Generalized, rx with nsaids.  . Varicose veins   . Systolic murmur 8/08    There is vigorous LV function. With this, there is slight increase in the gradient across the pulmonic valve.  . Allergic rhinitis    Current Outpatient Prescriptions on File Prior to Visit  Medication Sig Dispense Refill  . acetaminophen (TYLENOL) 650 MG suppository Place 650 mg rectally every 8 (eight) hours as needed for fever.    Marland Kitchen aspirin (BUFFERIN LOW DOSE) 81 MG EC tablet Take 1 tablet (81 mg total) by mouth daily. (Patient not taking: Reported on 01/17/2015) 90 tablet 4  . lisinopril-hydrochlorothiazide (PRINZIDE,ZESTORETIC) 20-25 MG per tablet Take 1 tablet by mouth daily. (Patient not taking: Reported on 01/17/2015) 90 tablet 4  . mupirocin ointment (BACTROBAN) 2 % Apply topically 3 (three) times daily.  (Patient not taking: Reported on 01/17/2015) 22 g 0   No current facility-administered medications on file prior to visit.   Family History  Problem Relation Age of Onset  . Hypertension Mother   . Hypertension Father    Social History   Social History  . Marital Status: Single    Spouse Name: N/A  . Number of Children: 0  . Years of Education: N/A   Occupational History  . hotel housekeeper    Social History Main Topics  . Smoking status: Never Smoker   . Smokeless tobacco: Not on file  . Alcohol Use: No  . Drug Use: No  . Sexual Activity: Not Currently   Other Topics Concern  . Not on file   Social History Narrative   Single immigrated from [place] in 1999. Lives at home by self. 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     Review of Systems: Other than what is stated in HPI, all other systems are negative.   Objective:   Filed Vitals:   01/17/15 0909  BP: 111/75  Pulse: 71  Temp: 98.9 F (37.2 C)  Resp: 16    Physical Exam  Constitutional: She is oriented to person, place, and time.  Cardiovascular: Normal rate, regular rhythm, normal heart sounds and intact distal pulses.   No murmur heard. Pulmonary/Chest: Effort normal and breath sounds normal.  Musculoskeletal: She exhibits edema (trace).  Neurological: She is alert and oriented to  person, place, and time.  Skin: Skin is warm and dry.     Severe varicose veins of bilateral lower extremities Small healing abscess     Lab Results  Component Value Date   WBC 7.0 12/24/2013   HGB 13.2 12/24/2013   HCT 38.9 12/24/2013   MCV 86.3 12/24/2013   PLT 241 12/24/2013   Lab Results  Component Value Date   CREATININE 0.78 12/24/2013   BUN 20 12/24/2013   NA 138 12/24/2013   K 4.0 12/24/2013   CL 99 12/24/2013   CO2 32 12/24/2013    Lab Results  Component Value Date   HGBA1C 5.7 12/24/2013   Lipid Panel     Component Value Date/Time   CHOL 173 12/24/2013 1642   TRIG 310* 12/24/2013 1642   HDL  55 12/24/2013 1642   CHOLHDL 3.1 12/24/2013 1642   VLDL 62* 12/24/2013 1642   LDLCALC 56 12/24/2013 1642       Assessment and plan:   Suttyn was seen today for establish care and blister.  Diagnoses and all orders for this visit:  Essential hypertension -    Refill lisinopril-hydrochlorothiazide (PRINZIDE,ZESTORETIC) 20-25 MG per tablet; Take 1 tablet by mouth daily. -     Lipid panel; Future -     COMPLETE METABOLIC PANEL WITH GFR; Future -     CBC; Future I've explained to patient to call one week prior to running out of medication for refills. Patient blood pressure is stable and may continue on current medication.  Education on diet, exercise, and modifiable risk factors discussed. Will obtain appropriate labs as needed. Will follow up in 3-6 months.   Varicose veins Advised patient to get compression stockings for while she is working and standing  Cellulitis and abscess of trunk Area on right abdomen is healing and I do not believe patient needs antibiotic at this time. Patient advised to use Dial antimicrobial soap. Return precautions explained.   Return in about 3 months (around 04/18/2015) for Hypertension and 1 week for lab visit.        Ambrose Finland, NP-C [facility] [phone] 01/17/2015, 9:33 AM

## 2015-01-17 NOTE — Patient Instructions (Addendum)
[facility] ? Address: [address] [place], [place] [address] [phone] Get compression stockings to wear while at work standing to help with swelling from spider veins   Dial Antimicrobial Soap  Venas varicosas (Varicose Veins) Las venas varicosas son venas que se han agrandado y se tornan sinuosas.  CAUSAS  Este trastorno es el resultado de un malfuncionamiento de las vlvulas de las venas. Las vlvulas de las venas ayudan al retorno de la sangre desde las piernas hacia el corazn. Si estas vlvulas se daan, el flujo sanguneo retorna hacia atrs y [employer] venas de la pierna, cerca de la piel. Esto hace que las venas se agranden debido al aumento de la presin en su interior. Es ms probable que desarrollen venas varicosas las personas que estn de pie durante mucho [place], las mujeres embarazadas o quienes tienen sobrepeso. SNTOMAS   Venas hinchadas, tortuosas, y [place], que se observan ms comnmente en las piernas.  Dolor en las piernas o sensacin de pesadez. Estos sntomas pueden empeorar hacia el final del da.  Hinchazn de las piernas.  Cambios en el color de la piel. DIAGNSTICO  Su mdico puede diagnosticarle venas varicosas con un examen de las piernas. Podr recomendarle una ecografa de las venas de sus piernas.  TRATAMIENTO  La mayora de las venas varicosas pueden tratarse en casa.Sin embargo, existen otros tratamientos para personas con sntomas persistentes o que desean tratar la apariencia de las venas varicosas. Entre estos tratamientos se incluyen:   Corporate treasurer de venas varicosas muy pequeas.  Medicamentos que se inyectan en la vena. Este medicamento endurece las paredes de la vena y Administrator, sports. Se denomina escleroterapia. Luego, deber [employer] ropa o vendajes que apliquen presin.  Ciruga. INSTRUCCIONES PARA EL CUIDADO DOMICILIARIO  No permanezca sentado o de pie en una posicin durante largos perodos. No se siente  con las piernas cruzadas. Descanse con las piernas Conservator, museum/gallery.  Use medias elsticas o de soporte. No use prendas apretadas alrededor de las piernas, pelvis o cintura.  Camine todo lo posible para aumentar el flujo de sangre.  Levante los pies de la cama por la noche, alrededor de 5 cm.  Si se ha cortado la piel que est sobre la vena y [place], recustese con la pierna elevada y presione con un pao limpio hasta que la sangre se [place]. Luego coloque una venda sobre el corte. Consulte a su mdico si contina sangrando o necesita una sutura. SOLICITE ATENCIN MDICA SI:  La piel que rodea el tobillo comienza a lesionarse.  Presenta dolor, enrojecimiento, sensibilidad o hinchazn en la pierna, sobre la vena.  Siente molestias y Radiographer, therapeutic pierna. Document Released: 01/31/2005 Document Revised: 07/16/2011 [facility] Patient Information 2015 [place], [place]. This information is not intended to replace advice given to you by your health care provider. Make sure you discuss any questions you have with your health care provider.

## 2015-01-24 ENCOUNTER — Ambulatory Visit: Payer: 59 | Attending: Internal Medicine

## 2015-01-24 DIAGNOSIS — I10 Essential (primary) hypertension: Secondary | ICD-10-CM

## 2015-01-24 LAB — COMPLETE METABOLIC PANEL WITH GFR
ALBUMIN: 4.3 g/dL (ref 3.6–5.1)
ALK PHOS: 73 U/L (ref 33–130)
ALT: 17 U/L (ref 6–29)
AST: 14 U/L (ref 10–35)
BUN: 16 mg/dL (ref 7–25)
CALCIUM: 9.8 mg/dL (ref 8.6–10.4)
CO2: 33 mmol/L — ABNORMAL HIGH (ref 20–31)
CREATININE: 0.74 mg/dL (ref 0.50–1.05)
Chloride: 102 mmol/L (ref 98–110)
GFR, Est African American: >89 mL/min (ref >=60)
GFR, Est Non African American: >89 mL/min (ref >=60)
GLUCOSE: 87 mg/dL (ref 65–99)
Potassium: 4.5 mmol/L (ref 3.5–5.3)
Sodium: 137 mmol/L (ref 135–146)
Total Bilirubin: 0.3 mg/dL (ref 0.2–1.2)
Total Protein: 6.6 g/dL (ref 6.1–8.1)

## 2015-01-24 LAB — CBC
HEMATOCRIT: 40.6 % (ref 36.0–46.0)
Hemoglobin: 13.5 g/dL (ref 12.0–15.0)
MCH: 28.8 pg (ref 26.0–34.0)
MCHC: 33.3 g/dL (ref 30.0–36.0)
MCV: 86.8 fL (ref 78.0–100.0)
MPV: 10.6 fL (ref 8.6–12.4)
Platelets: 219 10*3/uL (ref 150–400)
RBC: 4.68 MIL/uL (ref 3.87–5.11)
RDW: 13.5 % (ref 11.5–15.5)
WBC: 6.4 10*3/uL (ref 4.0–10.5)

## 2015-01-24 LAB — LIPID PANEL
CHOLESTEROL: 150 mg/dL (ref 125–200)
HDL: 62 mg/dL (ref >=46)
LDL Cholesterol: 71 mg/dL (ref <130)
Total CHOL/HDL Ratio: 2.4 Ratio (ref <=5.0)
Triglycerides: 84 mg/dL (ref <150)
VLDL: 17 mg/dL (ref <30)

## 2015-01-25 ENCOUNTER — Telehealth: Payer: Self-pay

## 2015-01-25 NOTE — Telephone Encounter (Signed)
-----   Message from Ambrose Finland, NP sent at 01/25/2015 11:10 AM EDT ----- Cholesterol looks good, other labs are normal

## 2015-01-25 NOTE — Telephone Encounter (Signed)
Interpreter line used Allison Cook [id number] Patient not available Message left on voice mail to return our call

## 2016-01-30 ENCOUNTER — Ambulatory Visit: Payer: 59 | Attending: Internal Medicine

## 2016-04-09 ENCOUNTER — Ambulatory Visit: Payer: Self-pay | Attending: Internal Medicine | Admitting: Internal Medicine

## 2016-04-09 ENCOUNTER — Encounter: Payer: Self-pay | Admitting: Internal Medicine

## 2016-04-09 VITALS — BP 111/74 | HR 67 | Temp 98.4°F | Resp 16 | Wt 200.4 lb

## 2016-04-09 DIAGNOSIS — E669 Obesity, unspecified: Secondary | ICD-10-CM | POA: Insufficient documentation

## 2016-04-09 DIAGNOSIS — D509 Iron deficiency anemia, unspecified: Secondary | ICD-10-CM | POA: Insufficient documentation

## 2016-04-09 DIAGNOSIS — Z0000 Encounter for general adult medical examination without abnormal findings: Secondary | ICD-10-CM

## 2016-04-09 DIAGNOSIS — Z8249 Family history of ischemic heart disease and other diseases of the circulatory system: Secondary | ICD-10-CM | POA: Insufficient documentation

## 2016-04-09 DIAGNOSIS — I10 Essential (primary) hypertension: Secondary | ICD-10-CM

## 2016-04-09 DIAGNOSIS — K219 Gastro-esophageal reflux disease without esophagitis: Secondary | ICD-10-CM | POA: Insufficient documentation

## 2016-04-09 DIAGNOSIS — M1990 Unspecified osteoarthritis, unspecified site: Secondary | ICD-10-CM | POA: Insufficient documentation

## 2016-04-09 DIAGNOSIS — Z1231 Encounter for screening mammogram for malignant neoplasm of breast: Secondary | ICD-10-CM

## 2016-04-09 DIAGNOSIS — Z1239 Encounter for other screening for malignant neoplasm of breast: Secondary | ICD-10-CM

## 2016-04-09 DIAGNOSIS — Z780 Asymptomatic menopausal state: Secondary | ICD-10-CM | POA: Insufficient documentation

## 2016-04-09 DIAGNOSIS — Z79899 Other long term (current) drug therapy: Secondary | ICD-10-CM | POA: Insufficient documentation

## 2016-04-09 DIAGNOSIS — Z23 Encounter for immunization: Secondary | ICD-10-CM

## 2016-04-09 DIAGNOSIS — Z114 Encounter for screening for human immunodeficiency virus [HIV]: Secondary | ICD-10-CM

## 2016-04-09 DIAGNOSIS — Z1159 Encounter for screening for other viral diseases: Secondary | ICD-10-CM

## 2016-04-09 DIAGNOSIS — Z131 Encounter for screening for diabetes mellitus: Secondary | ICD-10-CM

## 2016-04-09 DIAGNOSIS — Z7982 Long term (current) use of aspirin: Secondary | ICD-10-CM | POA: Insufficient documentation

## 2016-04-09 DIAGNOSIS — E119 Type 2 diabetes mellitus without complications: Secondary | ICD-10-CM | POA: Insufficient documentation

## 2016-04-09 LAB — CBC WITH DIFFERENTIAL/PLATELET
BASOS ABS: 0 {cells}/uL (ref 0–200)
Basophils Relative: 0 %
EOS ABS: 190 {cells}/uL (ref 15–500)
EOS PCT: 2 %
HEMATOCRIT: 43.5 % (ref 35.0–45.0)
HEMOGLOBIN: 14.6 g/dL (ref 11.7–15.5)
LYMPHS ABS: 2660 {cells}/uL (ref 850–3900)
Lymphocytes Relative: 28 %
MCH: 29.6 pg (ref 27.0–33.0)
MCHC: 33.6 g/dL (ref 32.0–36.0)
MCV: 88.1 fL (ref 80.0–100.0)
MONO ABS: 665 {cells}/uL (ref 200–950)
MPV: 11.3 fL (ref 7.5–12.5)
Monocytes Relative: 7 %
NEUTROS PCT: 63 %
Neutro Abs: 5985 cells/uL (ref 1500–7800)
Platelets: 225 10*3/uL (ref 140–400)
RBC: 4.94 MIL/uL (ref 3.80–5.10)
RDW: 13 % (ref 11.0–15.0)
WBC: 9.5 10*3/uL (ref 3.8–10.8)

## 2016-04-09 LAB — TSH: TSH: 1.46 mIU/L

## 2016-04-09 LAB — BASIC METABOLIC PANEL WITH GFR
BUN: 22 mg/dL (ref 7–25)
CO2: 26 mmol/L (ref 20–31)
Calcium: 10.5 mg/dL — ABNORMAL HIGH (ref 8.6–10.4)
Chloride: 99 mmol/L (ref 98–110)
Creat: 0.89 mg/dL (ref 0.50–1.05)
GFR, EST AFRICAN AMERICAN: 84 mL/min (ref >=60)
GFR, Est Non African American: 73 mL/min (ref >=60)
GLUCOSE: 112 mg/dL — AB (ref 65–99)
POTASSIUM: 4.4 mmol/L (ref 3.5–5.3)
Sodium: 139 mmol/L (ref 135–146)

## 2016-04-09 LAB — POCT GLYCOSYLATED HEMOGLOBIN (HGB A1C): Hemoglobin A1C: 5.4

## 2016-04-09 MED ORDER — LISINOPRIL-HYDROCHLOROTHIAZIDE 20-25 MG PO TABS: 1.0000 | ORAL_TABLET | Freq: Every day | ORAL | 4 refills | Status: DC

## 2016-04-09 NOTE — Progress Notes (Signed)
Allison Cook, is a 55 y.o. female  [id number]  [id number]  DOB - [birth year]  CC:  Chief Complaint  Patient presents with  . Hypertension       HPI: Allison Cook is a 55 y.o. female here today to establish medical care, last seen 9/16 w/ NP, w/ pmhx significant for htn, predm (per aic 2015), obesity.   Pt states she is doing well, just here for fu and refill of her meds. She is taking her bp meds as prescribed.  She is declining colonoscopy screening at this time, due to ride issues, and hesitant about the anesthesia. She said that cancer does not run in her family.  Due for MM screening, and amendable to doing if can get help w/ feeling out the ppwk for scholarship.  Does not smoke or drink etoh.  Patient has No headache, No chest pain, No abdominal pain - No Nausea, No new weakness tingling or numbness, No Cough - SOB.  Gets occasional arthritis which motrin helps with.   Interpreter was used to communicate directly with patient for the entire encounter including providing detailed patient instructions.    Review of Systems: Per hpi, o/w all systems reviewed and negative.   No Known Allergies Past Medical History:  Diagnosis Date  . Allergic rhinitis   . Anemia, iron deficiency    This has resolved  . Arthritis    Generalized, rx with nsaids.  Marland Kitchen. GERD (gastroesophageal reflux disease)   . Hypertension   . Irregular menstrual bleeding   . Otitis externa   . Systolic murmur 8/08   There is vigorous LV function. With this, there is slight increase in the gradient across the pulmonic valve.  . Varicose veins    Current Outpatient Prescriptions on File Prior to Visit  Medication Sig Dispense Refill  . aspirin (BUFFERIN LOW DOSE) 81 MG EC tablet Take 1 tablet (81 mg total) by mouth daily. 90 tablet 4  . ibuprofen (ADVIL,MOTRIN) 200 MG tablet Take 200 mg by mouth every 6 (six) hours as needed.    Marland Kitchen. acetaminophen (TYLENOL) 650 MG suppository  Place 650 mg rectally every 8 (eight) hours as needed for fever.    . mupirocin ointment (BACTROBAN) 2 % Apply topically 3 (three) times daily. (Patient not taking: Reported on 04/09/2016) 22 g 0   No current facility-administered medications on file prior to visit.    Family History  Problem Relation Age of Onset  . Hypertension Mother   . Hypertension Father    Social History   Social History  . Marital status: Single    Spouse name: N/A  . Number of children: 0  . Years of education: N/A   Occupational History  . hotel housekeeper    Social History Main Topics  . Smoking status: Never Smoker  . Smokeless tobacco: Not on file  . Alcohol use No  . Drug use: No  . Sexual activity: Not Currently   Other Topics Concern  . Not on file   Social History Narrative   Single immigrated from [place] in 1999. Lives at home by self. Works as a Advertising copywriterhousekeeper in a hotel.     Objective:   Vitals:   04/09/16 1420  BP: 111/74  Pulse: 67  Resp: 16  Temp: 98.4 F (36.9 C)    Filed Weights   04/09/16 1420  Weight: 200 lb 6.4 oz (90.9 kg)    BP Readings from Last 3 Encounters:  04/09/16 111/74  01/17/15 111/75  12/24/13 127/82    Physical Exam: Constitutional: Patient appears well-developed and well-nourished. No distress. AAOx3, obese, pleasant. HENT: Normocephalic, atraumatic, External right and left ear normal. Oropharynx is clear and moist.  bilat TMs clear. Eyes: Conjunctivae and EOM are normal. PERRL, no scleral icterus. Neck: Normal ROM. Neck supple. No JVD.  CVS: RRR, S1/S2 +, no murmurs, no gallops, no carotid bruit.  Pulmonary: Effort and breath sounds normal, no stridor, rhonchi, wheezes, rales.  Abdominal: Soft. BS +, obese, no distension, tenderness, rebound or guarding.  Musculoskeletal: Normal range of motion. No edema and no tenderness.  LE: bilat/ no c/c/e, pulses 2+ bilateral. Neuro: Alert. muscle tone coordination wnl. No cranial nerve deficit  grossly. Skin: Skin is warm and dry. No rash noted. Not diaphoretic. No erythema. No pallor. Psychiatric: Normal mood and affect. Behavior, judgment, thought content normal.  Lab Results  Component Value Date   WBC 6.4 01/24/2015   HGB 13.5 01/24/2015   HCT 40.6 01/24/2015   MCV 86.8 01/24/2015   PLT 219 01/24/2015   Lab Results  Component Value Date   CREATININE 0.74 01/24/2015   BUN 16 01/24/2015   NA 137 01/24/2015   K 4.5 01/24/2015   CL 102 01/24/2015   CO2 33 (H) 01/24/2015    Lab Results  Component Value Date   HGBA1C 5.4 04/09/2016   Lipid Panel     Component Value Date/Time   CHOL 150 01/24/2015 0907   TRIG 84 01/24/2015 0907   HDL 62 01/24/2015 0907   CHOLHDL 2.4 01/24/2015 0907   VLDL 17 01/24/2015 0907   LDLCALC 71 01/24/2015 0907        Depression screen PHQ 2/9 04/09/2016 01/17/2015 12/24/2013 10/30/2012 01/03/2012  Decreased Interest 0 0 0 0 0  Down, Depressed, Hopeless 0 0 0 0 0  PHQ - 2 Score 0 0 0 0 0    Assessment and plan:   1. Essential hypertension - well controlled, recd low salt diet. - has been taking asa 81 qd for years, her calculated ASVCD risk  Is <1.53% though in 10 years. - lisinopril-hydrochlorothiazide (PRINZIDE,ZESTORETIC) 20-25 MG tablet; Take 1 tablet by mouth daily.  Dispense: 90 tablet; Refill: 4 - BASIC METABOLIC PANEL WITH GFR - CBC with Differential  2. Health maintenance examination - rcd papsmear - recd otc calcium 1200mg  /qd. - VITAMIN D 25 Hydroxy (Vit-D Deficiency, Fractures) - TSH  3. Diabetes mellitus screening No dm, - HgB A1c  5.4  4. Breast cancer screening - MM Digital Screening; Future  - pt needs assistance w/ scholarship ppwk, asked cma to help/   5. Encounter for screening for HIV - HIV antibody (with reflex)  6. Need for hepatitis C screening test - Hepatitis C antibody  7. Postmenopausal - recd papsmear appt soon - recd at least calcium 1200mg  daily, vit d pending levels.  8. Financial  aid - currently has orange card/cone discount, but expires end of month - recd make appt to renew  9. Flu vac today  Return in about 4 weeks (around 05/07/2016) for pap smear.  The patient was given clear instructions to go to ER or return to medical center if symptoms don't improve, worsen or new problems develop. The patient verbalized understanding. The patient was told to call to get lab results if they haven't heard anything in the next week.    This note has been created with Education officer, environmentalDragon speech recognition software and smart phrase technology. Any transcriptional errors are unintentional.   Daliah Chaudoin T  Julien Nordmann, MD, MBA/MHA [facility] [place], [place] [phone]   04/09/2016, 3:44 PM

## 2016-04-09 NOTE — Patient Instructions (Addendum)
Calcium 1,265m daily.  Vit D - ? Pending labs today.  -  Financial aid packet / appt  - lost her card  MCarolina(Health Maintenance, Female) Un estilo de vida saludable y los cuidados preventivos pueden favorecer considerablemente a la salud y eMusician Pregunte a su mdico cul es el cronograma de exmenes peridicos apropiado para usted. Esta es una buena oportunidad para consultarlo sobre cmo prevenir enfermedades y mSugartownsano. Adems de los controles, hay muchas otras cosas que puede hacer usted mismo. Los expertos han realizado numerosas investigaciones sArvinMeritorcambios en el estilo de vida y las medidas de prevencin que, mPleasant Run Farm lo ayudarn a mantenerse sano. Solicite a su mdico ms informacin. EL PESO Y LA DIETA Consuma una dieta saludable.   Asegrese de iFamily Dollar Storesverduras, frutas, productos lcteos de bajo contenido de gDjiboutiy pAdvertising account planner  No consuma muchos alimentos de alto contenido de grasas slidas, azcares agregados o sal.  Realice actividad fsica con regularidad. Esta es una de las prcticas ms importantes que puede hacer por su salud.  La mayora de los adultos deben hacer ejercicio durante al menos 1593mutos por semana. El ejercicio debe aumentar la frecuencia cardaca y prActora transpiracin (ejercicio de inLomas  La mayora de los adultos tambin deben haField seismologistjercicios de elongacin al meToysRuseces a la semana. Agregue esto al su plan de ejercicio de intensidad moderada. Mantenga un peso saludable.   El ndice de masa corporal (IKimball Health Serviceses una medida que puede utilizarse para identificar posibles problemas de peNorth AugustaProporciona una estimacin de la grasa corporal basndose en el peso y la altura. Su mdico puede ayudarle a deRadiation protection practitionerMLerna a loScientist, forensic maTheatre managern peso saludable.  Para las mujeres de 20aos o ms:  Un IMWestside Surgery Center Ltdenor de 18,5 se considera bajo peso.  Un IMSurgical Specialties Of Arroyo Grande Inc Dba Oak Park Surgery Centerntre 18,5 y  24,9 es normal.  Un IMHenderson County Community Hospitalntre 25 y 29,9 se considera sobrepeso.  Un IMC de 30 o ms se considera obesidad. Observe los niveles de colesterol y lpidos en la sangre.   Debe comenzar a reEnglish as a second language teachere lpidos y coResearch officer, trade unionn la sangre a los 20aos y luego repetirlos cada 5a70aos Es posible que neAutomotive engineeros niveles de colesterol con mayor frecuencia si:  Sus niveles de lpidos y colesterol son altos.  Es mayor de 50aos.  Presenta un alto riesgo de padecer enfermedades cardacas. DETECCIN DE CNCER Cncer de pulmn   Se recomienda realizar exmenes de deteccin de cncer de pulmn a personas adultas entre [id number] que estn en riesgo de deHorticulturist, commerciale pulmn por sus antecedentes de consumo de tabaco.  Se recomienda una tomografa computarizada de baja dosis de los puLiberty Mediaos a las personas que:  Fuman actualmente.  Hayan dejado el hbito en algn momento en los ltimos 15aos.  Hayan fumado durante 30aos un paquete diario. Un paquete-ao equivale a fumar un promedio de un paquete de cigarrillos diario durante un ao.  Los exmenes de deteccin anuales deben continuar hasta que hayan pasado 15aos desde que dej de fumar.  Ya no debern realizarse si tiene un problema de salud que le impida recibir tratamiento para elScience writere pulmn. Cncer de mama   Practique la autoconciencia de la mama. Esto significa reconocer la apariencia normal de sus mamas y cmo las siente.  Tambin significa realizar autoexmenes regulares de laJohnson & JohnsonInforme a su mdico sobre cualquier cambio, sin importar cun  pequeo sea.  Si tiene entre 20 y 67 aos, un mdico debe realizarle un examen clnico de las mamas como parte del examen regular de [place], cada 1 a 3aos.  Si tiene 40aos o ms, debe Information systems manager clnico de las Microsoft. Tambin considere realizarse una Door ([place]) todos los [place].  Si tiene  antecedentes familiares de cncer de mama, hable con su mdico para someterse a un estudio gentico.  Si tiene alto riesgo de Chief Financial Officer de mama, hable con su mdico para someterse a Public house manager y [employer].  La evaluacin del gen del cncer de mama (BRCA) se recomienda a mujeres que tengan familiares con cnceres relacionados con el BRCA. Los cnceres relacionados con el BRCA incluyen los siguientes:  Mama.  Ovario.  Trompas.  Cnceres de peritoneo.  Los resultados de la evaluacin determinarn la necesidad de asesoramiento gentico y de [place] de BRCA1 y BRCA2. Cncer de cuello del tero  El mdico puede recomendarle que se haga pruebas peridicas de deteccin de cncer de los rganos de la pelvis (ovarios, tero y vagina). Estas pruebas incluyen un examen plvico, que abarca controlar si se produjeron cambios microscpicos en la superficie del cuello del tero (prueba de Papanicolaou). Pueden recomendarle que se haga estas pruebas cada 3aos, a partir de los 21aos.  A las mujeres que tienen entre 30 y 84aos, los mdicos pueden recomendarles que se sometan a exmenes plvicos y pruebas de Papanicolaou cada 6aos, o a la prueba de Papanicolaou y el examen plvico en combinacin con estudios de deteccin del virus del papiloma humano (VPH) cada 5aos. Algunos tipos de VPH aumentan el riesgo de Chief Financial Officer de cuello del tero. La prueba para la deteccin del VPH tambin puede realizarse a mujeres de cualquier edad cuyos resultados de la prueba de Papanicolaou no sean claros.  Es posible que otros mdicos no recomienden exmenes de deteccin a mujeres no embarazadas que se consideran sujetos de bajo riesgo de Chief Financial Officer de pelvis y que no tienen sntomas. Pregntele al mdico si un examen plvico de deteccin es adecuado para usted.  Si ha recibido un tratamiento para Science writer cervical o una enfermedad que podra causar cncer, necesitar  realizarse una prueba de Papanicolaou y controles durante al menos 45 aos de concluido el [place]. Si no se ha hecho el Papanicolaou con regularidad, debern volver a evaluarse los factores de riesgo (como tener un nuevo compaero sexual), para Teacher, adult education si debe realizarse los estudios nuevamente. Algunas mujeres sufren problemas mdicos que aumentan la probabilidad de Museum/gallery curator cncer de cuello del tero. En estos casos, el mdico podr QUALCOMM se realicen controles y pruebas de Papanicolaou con ms frecuencia. Cncer colorrectal   Este tipo de cncer puede detectarse y a menudo prevenirse.  Por lo general, los estudios de rutina se deben Medical laboratory scientific officer a Field seismologist a Proofreader de los 36 aos y Lafontaine 30 aos.  Sin embargo, el mdico podr aconsejarle que lo haga antes, si tiene factores de riesgo para el cncer de colon.  Tambin puede recomendarle que use un kit de prueba para Hydrologist en la materia fecal.  Es posible que se use una pequea cmara en el extremo de un tubo para examinar directamente el colon (sigmoidoscopia o colonoscopia) a fin de Hydrographic surveyor formas tempranas de cncer colorrectal.  Los exmenes de rutina generalmente comienzan a los 81aos.  El examen directo del colon se debe repetir cada 5 a 10aos  hasta los 75aos. Sin embargo, es posible que se realicen exmenes con mayor frecuencia, si se detectan formas tempranas de plipos precancerosos o pequeos bultos. Cncer de piel   Revise la piel de la cabeza a los pies con regularidad.  Informe a su mdico si aparecen nuevos lunares o los que tiene se modifican, especialmente en su forma y color.  Tambin notifique al mdico si tiene un lunar que es ms grande que el tamao de una goma de lpiz.  Siempre use pantalla solar. Aplique pantalla solar de Kerry Dory y repetida a lo largo del Training and development officer.  Protjase usando mangas y [employer] Service[employer] Company, un sombrero de ala ancha y gafas para el sol, siempre que se encuentre en  el exterior. ENFERMEDADES CARDACAS, DIABETES E HIPERTENSIN ARTERIAL  La hipertensin arterial causa enfermedades cardacas y [place] el riesgo de ictus. La hipertensin arterial es ms probable en los siguientes casos:  Las personas que tienen la presin arterial en el extremo del rango normal (100-139/85-89 mm Hg).  Las personas con sobrepeso u obesidad.  Las Retail banker.  Si usted tiene entre 18 y 39 aos, debe medirse la presin arterial cada 3 a 5 aos. Si usted tiene 40 aos o ms, debe medirse la presin arterial Hewlett[employer]ard. Debe medirse la presin arterial dos veces: una vez cuando est en un hospital o una clnica y la otra vez cuando est en otro sitio. Registre el promedio de [employer] Stores. Para controlar su presin arterial cuando no est en un hospital o [place], puede usar lo siguiente:  Ardelia Mems mquina automtica para medir la presin arterial en una farmacia.  Un monitor para medir la presin arterial en el hogar.  Si tiene entre 66 y 37 aos, consulte a su mdico si debe tomar aspirina para prevenir el ictus.  Realcese exmenes de deteccin de la diabetes con regularidad. Esto incluye la toma de [place] de sangre para controlar el nivel de azcar en la sangre durante el [place].  Si tiene un peso normal y un bajo riesgo de padecer diabetes, realcese este anlisis cada tres aos despus de los 45aos.  Si tiene sobrepeso y un alto riesgo de padecer diabetes, considere someterse a este anlisis antes o con mayor frecuencia. PREVENCIN DE INFECCIONES HepatitisB   Si tiene un riesgo ms alto de Museum/gallery curator hepatitis B, debe someterse a un examen de deteccin de este virus. Se considera que tiene un alto riesgo de Museum/gallery curator hepatitis B si:  Naci en un pas donde la hepatitis B es frecuente. Pregntele a su mdico qu pases son considerados de Public affairs consultant.  Sus padres nacieron en un pas de alto riesgo y usted no recibi una vacuna que lo proteja  contra la hepatitis B (vacuna contra la hepatitis B).  [place].  [place] agujas para inyectarse drogas.  Vive con alguien que tiene hepatitis B.  Ha tenido sexo con alguien que tiene hepatitis B.  Recibe tratamiento de hemodilisis.  Toma ciertos medicamentos para el cncer, trasplante de rganos y afecciones autoinmunitarias. Hepatitis C   Se recomienda un anlisis de [place] para:  Todos los que nacieron entre 1945 y [phone].  Todas las personas que tengan un riesgo de haber contrado hepatitis C. Enfermedades de transmisin sexual (ETS).   Debe realizarse pruebas de deteccin de enfermedades de transmisin sexual (ETS), incluidas gonorrea y clamidia si:  Es sexualmente activo y es menor de 24aos.  Es mayor de 24aos, y Investment banker, operational informa que corre Sales executive  de [employer] tipo de infecciones.  La actividad sexual ha cambiado desde que le hicieron la ltima prueba de deteccin y tiene un riesgo mayor de Best boy clamidia o Radio broadcast assistant. Pregntele al mdico si usted tiene riesgo.  Si no tiene el VIH, pero corre riesgo de infectarse por el virus, se recomienda tomar diariamente un medicamento recetado para evitar la infeccin. Esto se conoce como profilaxis previa a la exposicin. Se considera que est en riesgo si:  Es [place] sexualmente y no [place] preservativos habitualmente o no conoce el estado del VIH de sus Advertising copywriter.  Se inyecta drogas.  Es [place] sexualmente con Ardelia Mems pareja que tiene VIH. Consulte a su mdico para saber si tiene un alto riesgo de infectarse por el VIH. Si opta por comenzar la profilaxis previa a la exposicin, primero debe realizarse anlisis de deteccin del VIH. Luego, le harn anlisis cada 20mses mientras est tomando los medicamentos para la profilaxis previa a la exposicin. [facility] Si es premenopusica y puede quedar eAbeytas solicite a su mdico asesoramiento previo a la concepcin.  Si puede quedar embarazada, tome 400 a  [id number](mcg) de cido fAnheuser-Busch  Si desea evitar el embarazo, hable con su mdico sobre el control de la natalidad (anticoncepcin). OSTEOPOROSIS Y MENOPAUSIA  La osteoporosis es una enfermedad en la que los huesos pierden los minerales y la fuerza por el avance de la edad. El resultado pueden ser fracturas graves en los hPlain City El riesgo de osteoporosis puede identificarse con uArdelia Memsprueba de densidad sea.  Si tiene 65aos o ms, o si est en riesgo de sufrir osteoporosis y fracturas, pregunte a su mdico si debe someterse a exmenes.  Consulte a su mdico si debe tomar un suplemento de calcio o de vitamina D para reducir el riesgo de osteoporosis.  La menopausia puede presentar ciertos sntomas fsicos y rGaffer  La terapia de reemplazo hormonal puede reducir algunos de estos sntomas y rGaffer Consulte a su mdico para saber si la terapia de reemplazo hormonal es conveniente para usted. INSTRUCCIONES PARA EL CUIDADO EN EL HOGAR  Realcese los estudios de rutina de la salud, dentales y de lPublic librarian  MGustavus  No consuma ningn producto que contenga tabaco, lo que incluye cigarrillos, tabaco de mHigher education careers advisero cPsychologist, sport and exercise  Si est embarazada, no beba alcohol.  Si est amamantando, reduzca el consumo de alcohol y la frecuencia con la que consume.  Si es mujer y no est embarazada limite el consumo de alcohol a no ms de 1 medida por da. Una medida equivale a 12onzas de cerveza, 5onzas de vino o 1onzas de bebidas alcohlicas de alta graduacin.  No consuma drogas.  No comparta agujas.  Solicite ayuda a su mdico si necesita apoyo o informacin para abandonar las drogas.  Informe a su mdico si a menudo se siente deprimido.  Notifique a su mdico si alguna vez ha sido vctima de abuso o si no se siente seguro en su hogar. Esta informacin no tiene cMarine scientistel consejo del mdico. Asegrese de hacerle al mdico  cualquier pregunta que tenga. Document Released: 04/12/2011 Document Revised: 05/14/2014 Document Reviewed: 01/25/2015 Elsevier Interactive Patient Education  2017 EWhitewaterde alimentacin con bajo contenido de sodio (Low-Sodium Eating Plan) El sodio aumenta la presin arterial y hace que el cuerpo retenga lquidos. El consumo de alimentos con menos sodio ayuda a disminuir la presin arterial, a rForensic psychologisty a  proteger el corazn, el hgado y los riones. Agregar sal (cloruro de sodio) a los alimentos aumenta el aporte de [place]. La mayor parte del sodio proviene de los alimentos enlatados, envasados y congelados. La pizza, la comida rpida y la comida de los restaurantes tambin contienen mucho sodio. Aunque usted tome medicamentos para bajar la presin arterial o reducir el lquido del cuerpo, es importante que disminuya el aporte de sodio de los alimentos. EN QU CONSISTE EL PLAN? La [employer] de las personas deberan limitar la ingesta de sodio a 2353m por dTraining and development officer El mdico le recomienda que limite su consumo de sodio a __________ pHoney[place] QU DEBO SABER ACERCA DE ESTE PLAN DE ACamas Para el plan de alimentacin con bajo contenido de sodio, debe seguir estas pautas generales:  Elija alimentos con un valor porcentual diario de sodio de menos del 5% (segn se indica en la etiqueta).  Use hierbas o aderezos sin sal, en lugar de sal de mesa o sal marina.  Consulte al mdico o farmacutico antes de usar sustitutos de la sal.  Coma alimentos frescos.  Coma ms frutas y verduras.  Limite las verduras enlatadas. Si las consume, enjuguelas bien para disminuir el sodio.  Limite el consumo de queso a 1onza (28g) por dTraining and development officer  Coma productos con bajo contenido de sodio, cuya etiqueta suele decir "bajo contenido de sodio" o "sin agregado de sal".  Evite alimentos que contengan glutamato monosdico (MSG), que a veces se agrega a la comida cThailandy a algunos  alimentos enlatados.  Consulte las etiquetas de los alimentos (etiquetas de informacin nutricional) para saber cunto sodio contiene una porcin.  Consuma ms comida casera y menos de restaurante, de buf y comida rpida.  Cuando coma en un restaurante, pida que preparen su comida con menos sal o, en lo posible, sin nada de sal. CMO LEO LA INFORMACIN SOBRE EL SODIO EN LAS ETIQUETAS DE LOS ALIMENTOS? La etiqueta de informacin nutricional indica la cantidad de sodio en una porcin de alimento. Si come ms de una porcin, debe multiplicar la cantidad indicada de sodio por la cantidad de porciones. Las etiquetas de los alimentos tambin pueden indicar lo siguiente:  Sin sodio: menos de 538mpor porcin.  Cantidad muy baja de sodio: 352m menos por porcin.  Cantidad baja de sodio: 140m42mmenos por porcin.  Menor cantidad de sodio: 50% menos de sodio en una porcin. Por ejemplo, si un alimento generalmente contiene 300 mg de sodio se modifica para ser consNVR Incndr 150 mg de sodio.  Sodio reducido: 25% menos de sodiAgricultural consultantr ejemplo, si un alimento que por lo general contiene 400mg54msodio se modifica para convertirse en un alimento de sodio reducido, tendr 300mg 81modio. QU ALIMENTOS PUEDO COMER? Cereales  Cereales con bajo contenido de sodio, como avena,[place]ixburgz y trigo infladBronsonigo triturado. Galletas con bajo contenido de sodio.[place]z y pastas sin sal. Pan con bajo contenido de sodio.Hanc[place] Bridgeuras  Verduras frescas o congeladas. Verduras enlatadas con bajo contenido de sodio o reducido de sodio. Pasta y salsa de tomate con contenido bajo o reduciMarcuss de tomate y verduras con contenido bajo o reducido de sodio. FrutasLambert Modyas frescas, congeladas y enlataIT sales professional de frutas. Carnes y otros productos con protenas  Atn y salmn enlatado con bajo contenido de sodio.[place] Salisburye de vaca o ave, pescado y frutos de mar frescos o  congelados. Cordero. Frutos secos sin sal. Lentejas, frijoles y  guisantes secos, sin sal agregada. Frijoles enlatados sin sal. Sopas caseras sin sal. Huevos. Lcteos  Leche. Leche de soja. Queso ricota. Quesos con contenido bajo o reducido de sodio. Yogur. Condimentos  Hierbas y especias frescas y secas. Aderezos sin sal. [place] y ajo en polvo. Variedades de [place] y ketchup con bajo contenido de sodio. Rbano picante fresco o refrigerado. Jugo de limn. Grasas y aceites  Aderezos para ensalada con contenido reducido de [place]. Mantequilla sin sal. Otros  Palomitas de maz y pretzels sin sal. Los artculos mencionados arriba pueden no ser Dean Foods Company de las bebidas o los alimentos recomendados. Comunquese con el nutricionista para conocer ms opciones.  QU ALIMENTOS NO SE RECOMIENDAN? Cereales  Cereales instantneos para comer caliente. Mezclas para bizcochos, panqueques y rellenos de pan. Crutones. Mezclas para pastas o arroz con condimento. Envases comerciales de sopa de fideos. Macarrones con queso envasados o congelados. Harina leudante. Galletas saladas comunes. Verduras  Verduras enlatadas comunes. Pasta y salsa de tomate en lata comunes. Jugos comunes de tomate y de verduras. Verduras Surveyor, minerals. Papas fritas saladas. Aceitunas. Pepinillos. Salsas. Chucrut. Salsa. Carnes y otros productos con protenas  Carne de vaca, pescado o frutos de mar que est salada, [place], [place], condimentada con especias o con pickles. Panceta, jamn, salchichas, perros calientes, carne curada, carne picada (carne envasada de buey) y embutidos. Cerdo salado. Cecina o charqui. Arenque en escabeche. Anchoas, atn enlatado comn y sardinas. Frutos secos con sal. Ines Bloomer para untar y quesos procesados. Requesn. Queso azul y cottage. Suero de [place]. Condimentos  Sal de cebolla y ajo, sal condimentada, sal de mesa y sal marina. Salsas en lata y envasadas. Salsa Worcester[place]. Salsa  trtara. Salsa barbacoa. Salsa teriyaki. Salsa de soja, incluso la que tiene contenido reducido de [place]. Salsa de carne. Salsa de pescado. Salsa de Ragan. Salsa rosada. Rbano picante envasado. Ketchup y mostaza comunes. Saborizantes y tiernizantes para carne. Caldo en cubitos. Salsa picante. Salsa tabasco. Adobos. Aderezos para tacos. Salsas. Grasas y aceites  Aderezos comunes para ensalada. [place] con sal. Margarina. Mantequilla clarificada. Grasa de panceta. Otros  Nachos y papas fritas envasadas. Maz inflado y frituras de maz. Palomitas de maz y pretzels con sal. Sopas enlatadas o en polvo. Pizza. Pasteles y entradas congeladas. Los artculos mencionados arriba pueden no ser Dean Foods Company de las bebidas y los alimentos que se Higher education careers adviser. Comunquese con el nutricionista para obtener ms informacin.  Esta informacin no tiene Marine scientist el consejo del mdico. Asegrese de hacerle al mdico cualquier pregunta que tenga. Document Released: 04/23/2005 Document Revised: 05/14/2014 Document Reviewed: 02/25/2013 Elsevier Interactive Patient Education  2017 [place].   -  Hipertensin (Hypertension) El trmino hipertensin es otra forma de denominar a la presin arterial elevada. La presin arterial elevada fuerza al corazn a trabajar ms para bombear la sangre. Una lectura de la presin arterial consta de dos nmeros: uno ms alto sobre uno ms bajo (por ejemplo, 110/72). CUIDADOS EN EL HOGAR  Haga que el mdico le tome nuevamente la presin arterial.  Tome los medicamentos solamente como se lo haya indicado el mdico. Siga cuidadosamente las indicaciones. Los medicamentos pierden eficacia si omite dosis. El hecho de omitir las dosis tambin [place] el riesgo de otros problemas.  No fume.  Contrlese la presin arterial en su casa como se lo haya indicado el mdico. SOLICITE AYUDA SI:  Piensa que tiene una reaccin a los medicamentos que est tomando.  [place] o dolores de [place]  reiterados.  Se le inflaman (hinchan) los tobillos.  Tiene problemas de visin. SOLICITE AYUDA DE INMEDIATO SI:  Tiene un dolor de cabeza muy intenso y est confundido.  Se siente dbil, aturdido o se desmaya.  Tiene dolor en el pecho o el estmago (abdominal).  Tiene vmitos.  No puede respirar [employer]. ASEGRESE DE QUE:  Comprende estas instrucciones.  Controlar su afeccin.  Recibir ayuda de inmediato si no mejora o si empeora. Esta informacin no tiene Marine scientist el consejo del mdico. Asegrese de hacerle al mdico cualquier pregunta que tenga. Document Released: 10/11/2009 Document Revised: 04/28/2013 Document Reviewed: 02/13/2013 Elsevier Interactive Patient Education  2017 Elsevier Inc. Influenza Virus Vaccine injection (Fluarix) Qu es este medicamento? La VACUNA ANTIGRIPAL ayuda a disminuir el riesgo de contraer la influenza, tambin conocida como la gripe. La vacuna solo ayuda a protegerle contra algunas cepas de influenza. Esta vacuna no ayuda a reducir Catering manager de contraer influenza pandmica H1N1. Este medicamento puede ser utilizado para otros usos; si tiene alguna pregunta consulte con su proveedor de atencin mdica o con su farmacutico. MARCAS COMUNES: Fluarix, Fluzone Qu le debo informar a mi profesional de la salud antes de tomar este medicamento? Necesita saber si usted presenta alguno de los siguientes problemas o situaciones: -trastorno de sangrado como hemofilia -fiebre o infeccin -sndrome de Guillain-Barre u otros problemas neurolgicos -problemas del sistema inmunolgico -infeccin por el virus de la inmunodeficiencia humana (VIH) o SIDA -niveles bajos de plaquetas en la sangre -esclerosis mltiple -una Risk analyst o inusual a las vacunas antigripales, a los huevos, protenas de pollo, al ltex, a la gentamicina, a otros medicamentos, alimentos, colorantes o conservantes -si est embarazada o  buscando quedar embarazada -si est amamantando a un beb Cmo debo utilizar este medicamento? Esta vacuna se administra mediante inyeccin por va intramuscular. Lo administra un profesional de [employer]	[place]. Recibir una copia de informacin escrita sobre la vacuna antes de cada vacuna. Asegrese de leer este folleto cada vez cuidadosamente. Este folleto puede cambiar con frecuencia. Hable con su pediatra para informarse acerca del uso de este medicamento en nios. Puede requerir atencin especial. Sobredosis: Pngase en contacto inmediatamente con un centro toxicolgico o una sala de urgencia si usted cree que haya tomado demasiado medicamento. ATENCIN: ConAgra Foods es solo para usted. No comparta este medicamento con nadie. Qu sucede si me olvido de una dosis? No se aplica en este caso. Qu puede interactuar con este medicamento? -quimioterapia o radioterapia -medicamentos que suprimen el sistema inmunolgico, tales como etanercept, anakinra, infliximab y adalimumab -medicamentos que tratan o previenen cogulos sanguneos, como warfarina -fenitona -medicamentos esteroideos, como la prednisona o la cortisona -teofilina -vacunas Puede ser que esta lista no menciona todas las posibles interacciones. Informe a su profesional de [employer]	[place] de [employer] productos a base de hierbas, medicamentos de [place] o suplementos nutritivos que est tomando. Si usted fuma, consume bebidas alcohlicas o si utiliza drogas ilegales, indqueselo tambin a su profesional de [employer]	[place]. Algunas sustancias pueden interactuar con su medicamento. A qu debo estar atento al usar [employer]? Informe a su mdico o a Barrister's clerk de la [employer] todos los efectos secundarios que persistan despus de 3 das. Llame a su proveedor de atencin mdica si se presentan sntomas inusuales dentro de las 6 semanas posteriores a la vacunacin. Es posible que todava pueda contraer la gripe, pero la enfermedad no  ser tan fuerte como normalmente. No puede contraer la gripe de esta vacuna. La vacuna antigripal no  le protege contra resfros u otras enfermedades que pueden causar [place]. Debe vacunarse cada ao. Qu efectos secundarios puedo tener al [employer] este medicamento? Efectos secundarios que debe informar a su mdico o a Barrister's clerk de la salud tan pronto como sea posible: -reacciones alrgicas como erupcin cutnea, picazn o urticarias, hinchazn de la cara, labios o lengua Efectos secundarios que, por lo general, no requieren atencin mdica (debe informarlos a su mdico o a su profesional de la salud si persisten o si son molestos): -fiebre -dolor de cabeza -molestias y dolores musculares -dolor, sensibilidad, enrojecimiento o Estate agent de la inyeccin -cansancio o debilidad Puede ser que esta lista no menciona todos los posibles efectos secundarios. Comunquese a su mdico por asesoramiento mdico [employer]. Usted puede informar los efectos secundarios a la FDA por telfono al 1-800-FDA-1088. Dnde debo guardar mi medicina? Esta vacuna se administra solamente en clnicas, farmacias, consultorio mdico u otro consultorio de un profesional de la salud y no Sports coach en su domicilio. ATENCIN: Este folleto es un resumen. Puede ser que no cubra toda la posible informacin. Si usted tiene preguntas acerca de esta medicina, consulte con su mdico, su farmacutico o su profesional de Technical sales engineer.  2017 Elsevier/Gold Standard (2009-10-25 15:31:40)

## 2016-04-10 ENCOUNTER — Other Ambulatory Visit: Payer: Self-pay | Admitting: Internal Medicine

## 2016-04-10 LAB — VITAMIN D 25 HYDROXY (VIT D DEFICIENCY, FRACTURES): Vit D, 25-Hydroxy: 17 ng/mL — ABNORMAL LOW (ref 30–100)

## 2016-04-10 LAB — HIV ANTIBODY (ROUTINE TESTING W REFLEX): HIV 1&2 Ab, 4th Generation: NONREACTIVE

## 2016-04-10 LAB — HEPATITIS C ANTIBODY: HCV Ab: NEGATIVE

## 2016-04-10 MED ORDER — VITAMIN D (ERGOCALCIFEROL) 1.25 MG (50000 UNIT) PO CAPS: 50000.0000 [IU] | ORAL_CAPSULE | ORAL | 0 refills | Status: DC

## 2016-04-13 ENCOUNTER — Telehealth: Payer: Self-pay | Admitting: *Deleted

## 2016-04-13 DIAGNOSIS — I10 Essential (primary) hypertension: Secondary | ICD-10-CM

## 2016-04-16 ENCOUNTER — Other Ambulatory Visit: Payer: Self-pay | Admitting: *Deleted

## 2016-04-16 DIAGNOSIS — I10 Essential (primary) hypertension: Secondary | ICD-10-CM

## 2016-04-16 MED ORDER — LISINOPRIL-HYDROCHLOROTHIAZIDE 20-25 MG PO TABS: 1.0000 | ORAL_TABLET | Freq: Every day | ORAL | 4 refills | Status: DC

## 2016-04-16 NOTE — Telephone Encounter (Signed)
Received fax RF request from [facility]. Rx resent electronically Guy Francoravia Noal Abshier, BSN, RN

## 2016-04-18 ENCOUNTER — Telehealth: Payer: Self-pay

## 2016-04-18 NOTE — Telephone Encounter (Signed)
[employer] Jeanette Id: [id number] Contacted pt to go over lab results pt is aware and doesn't have any questions or concerns  

## 2016-04-19 NOTE — Telephone Encounter (Signed)
Medication refilled

## 2016-04-23 ENCOUNTER — Other Ambulatory Visit: Payer: Self-pay | Admitting: Internal Medicine

## 2016-04-23 DIAGNOSIS — I10 Essential (primary) hypertension: Secondary | ICD-10-CM

## 2016-05-21 ENCOUNTER — Ambulatory Visit
Admission: RE | Admit: 2016-05-21 | Discharge: 2016-05-21 | Disposition: A | Discharge status: Home / Self Care | Payer: No Typology Code available for payment source | Source: Ambulatory Visit | Attending: Internal Medicine | Admitting: Internal Medicine

## 2016-05-21 DIAGNOSIS — Z1239 Encounter for other screening for malignant neoplasm of breast: Secondary | ICD-10-CM

## 2016-05-28 ENCOUNTER — Ambulatory Visit: Payer: Self-pay | Attending: Internal Medicine | Admitting: Physician Assistant

## 2016-05-28 VITALS — BP 118/75 | HR 87 | Temp 98.7°F | Resp 20 | Wt 196.0 lb

## 2016-05-28 DIAGNOSIS — S30814A Abrasion of vagina and vulva, initial encounter: Secondary | ICD-10-CM

## 2016-05-28 DIAGNOSIS — R300 Dysuria: Secondary | ICD-10-CM | POA: Insufficient documentation

## 2016-05-28 DIAGNOSIS — R319 Hematuria, unspecified: Secondary | ICD-10-CM

## 2016-05-28 DIAGNOSIS — I10 Essential (primary) hypertension: Secondary | ICD-10-CM | POA: Insufficient documentation

## 2016-05-28 DIAGNOSIS — N390 Urinary tract infection, site not specified: Secondary | ICD-10-CM

## 2016-05-28 LAB — POCT URINALYSIS DIPSTICK
Glucose, UA: NEGATIVE
Nitrite, UA: NEGATIVE
PH UA: 6.5
Protein, UA: 30
Spec Grav, UA: 1.015
Urobilinogen, UA: 0.2

## 2016-05-28 MED ORDER — PHENAZOPYRIDINE HCL 200 MG PO TABS: 200.0000 mg | ORAL_TABLET | Freq: Three times a day (TID) | ORAL | 0 refills | Status: AC | PRN

## 2016-05-28 MED ORDER — CIPROFLOXACIN HCL 500 MG PO TABS: 500.0000 mg | ORAL_TABLET | Freq: Two times a day (BID) | ORAL | 0 refills | Status: DC

## 2016-05-28 MED ORDER — ZINC OXIDE 40 % EX OINT: 1.0000 "application " | TOPICAL_OINTMENT | CUTANEOUS | 0 refills | Status: DC | PRN

## 2016-05-28 NOTE — Patient Instructions (Signed)
Infeccin urinaria en los adultos (Urinary Tract Infection, Adult) Una infeccin urinaria (IU) puede ocurrir en cualquier lugar de las vas urinarias. Las vas urinarias incluyen lo siguiente:  Riones.  Urteres.  Vejiga.  Uretra. Estos rganos fabrican, almacenan y eliminan la orina del organismo. CUIDADOS EN EL HOGAR  Tome los medicamentos de venta libre y los recetados solamente como se lo haya indicado el mdico.  Si le recetaron un antibitico, tmelo como se lo haya indicado el mdico. No deje de tomar los antibiticos aunque comience a sentirse mejor.  Evite beber lo siguiente:  Alcohol.  Cafena.  T.  Bebidas con gas.  Beba suficiente lquido para mantener el pis claro o de color amarillo plido.  Concurra a todas las visitas de control como se lo haya indicado el mdico. Esto es importante.  Asegrese de lo siguiente:  Vaciar la vejiga con frecuencia y en su totalidad. No contener la orina durante largos perodos.  Vaciar la vejiga antes y despus de tener relaciones sexuales.  Limpiar de adelante hacia atrs despus de defecar, si es mujer. Usar cada trozo de papel una vez cuando se limpie. SOLICITE AYUDA SI:  Siente dolor en la espalda.  Tiene fiebre.  Siente malestar estomacal (nuseas).  Vomita.  Los sntomas no mejoran despus de 3das de tratamiento.  Los sntomas desaparecen y luego reaparecen. SOLICITE AYUDA DE INMEDIATO SI:  Siente un dolor muy intenso en la espalda.  Siente un dolor muy intenso en la parte inferior del abdomen.  Tiene vmitos y no puede retener los medicamentos ni el agua. Esta informacin no tiene como fin reemplazar el consejo del mdico. Asegrese de hacerle al mdico cualquier pregunta que tenga. Document Released: 10/11/2009 Document Revised: 08/15/2015 Document Reviewed: 03/14/2015 Elsevier Interactive Patient Education  2017 Elsevier Inc.  

## 2016-05-28 NOTE — Progress Notes (Signed)
Pt [facility] with concerns about vaginal irritation. She states irritation has been going on since 05/23/16. She noticed after she used scented wipes after intercourse she had some discomfort. She states she used Monistat and Neosporin with little comfort.

## 2016-05-28 NOTE — Progress Notes (Signed)
Subjective:     Patient ID: Allison Cook, female   DOB: 04/24/1961, 56 y.o.   MRN: [id number]  HPI 56 yo Hispanic female with a PMH of HTN presents with 6 day history of dysuria and suprapubic pressure. Has used towelettes, Neosporin, and Myconazole with no relief. Feels the towelettes have irritated the perineum. Denies vaginal discharge, high risk sexual practices, flank pain, back pain, fever, chills, nausea, vomiting, or abdominal pain.   Review of Systems  Constitutional: Negative for chills, fatigue and fever.  Respiratory: Negative for shortness of breath.   Cardiovascular: Negative for chest pain.  Gastrointestinal: Negative for abdominal pain, constipation, diarrhea, nausea and vomiting.  Genitourinary: Positive for dysuria and pelvic pain. Negative for decreased urine volume, flank pain, frequency and vaginal discharge.  Musculoskeletal: Negative for back pain.  Skin: Negative for rash and wound.  Neurological: Negative for headaches.       Objective:   Physical Exam  Constitutional: She is oriented to person, place, and time. She appears well-developed and well-nourished.  HENT:  Head: Normocephalic and atraumatic.  Cardiovascular: Normal rate, regular rhythm and normal heart sounds.  Exam reveals no gallop and no friction rub.   No murmur heard. Pulmonary/Chest: Effort normal and breath sounds normal.  Abdominal: Soft. Bowel sounds are normal. She exhibits no distension. There is no guarding.  Mild suprapubic tenderness to palpation  Musculoskeletal: She exhibits no edema.  Neurological: She is alert and oriented to person, place, and time.  Skin: Skin is warm and dry.  Psychiatric: She has a normal mood and affect.       Assessment:    Plan:   Diagnoses and all orders for this visit:  Urinary tract infection with hematuria, site unspecified -     ciprofloxacin (CIPRO) 500 MG tablet; Take 1 tablet (500 mg total) by mouth 2 (two) times daily. -      phenazopyridine (PYRIDIUM) 200 MG tablet; Take 1 tablet (200 mg total) by mouth 3 (three) times daily as needed for pain.  Abrasion of vulva, initial encounter -     liver oil-zinc oxide (DESITIN) 40 % ointment; Apply 1 application topically as needed for irritation.

## 2016-05-29 NOTE — Addendum Note (Signed)
Addended by: Guy FrancoBENJAMIN, Claborn Janusz on: 05/29/2016 08:39 AM   Modules accepted: Orders

## 2016-05-30 ENCOUNTER — Telehealth: Payer: Self-pay

## 2016-05-30 NOTE — Telephone Encounter (Signed)
[employer] [place] Id: [id number] Contacted pt to go over lab results pt vm was not set up was unable to lvm

## 2016-06-04 ENCOUNTER — Ambulatory Visit: Payer: Self-pay

## 2016-06-04 ENCOUNTER — Ambulatory Visit: Payer: Self-pay | Attending: Physician Assistant | Admitting: Physician Assistant

## 2016-06-04 ENCOUNTER — Encounter: Payer: Self-pay | Admitting: Physician Assistant

## 2016-06-04 VITALS — BP 112/71 | HR 70 | Temp 98.2°F | Resp 16 | Wt 192.4 lb

## 2016-06-04 DIAGNOSIS — I10 Essential (primary) hypertension: Secondary | ICD-10-CM | POA: Insufficient documentation

## 2016-06-04 DIAGNOSIS — Z79899 Other long term (current) drug therapy: Secondary | ICD-10-CM | POA: Insufficient documentation

## 2016-06-04 DIAGNOSIS — L989 Disorder of the skin and subcutaneous tissue, unspecified: Secondary | ICD-10-CM | POA: Insufficient documentation

## 2016-06-04 DIAGNOSIS — K219 Gastro-esophageal reflux disease without esophagitis: Secondary | ICD-10-CM | POA: Insufficient documentation

## 2016-06-04 DIAGNOSIS — Z7982 Long term (current) use of aspirin: Secondary | ICD-10-CM | POA: Insufficient documentation

## 2016-06-04 DIAGNOSIS — D509 Iron deficiency anemia, unspecified: Secondary | ICD-10-CM | POA: Insufficient documentation

## 2016-06-04 DIAGNOSIS — N949 Unspecified condition associated with female genital organs and menstrual cycle: Secondary | ICD-10-CM

## 2016-06-04 DIAGNOSIS — Z124 Encounter for screening for malignant neoplasm of cervix: Secondary | ICD-10-CM

## 2016-06-04 DIAGNOSIS — N3000 Acute cystitis without hematuria: Secondary | ICD-10-CM | POA: Insufficient documentation

## 2016-06-04 DIAGNOSIS — I8393 Asymptomatic varicose veins of bilateral lower extremities: Secondary | ICD-10-CM | POA: Insufficient documentation

## 2016-06-04 LAB — POCT URINALYSIS DIPSTICK
Glucose, UA: NEGATIVE
Ketones, UA: NEGATIVE
Nitrite, UA: NEGATIVE
PROTEIN UA: 30
SPEC GRAV UA: 1.015
Urobilinogen, UA: 1
pH, UA: 6

## 2016-06-04 NOTE — Patient Instructions (Addendum)
We have conducted a PAP smear and will send to the lab. The lab will tell us if they detect cancer cells, herpes, chlamydia, or gonorrhea. Please abstain from sex for now until we have answers from the lab in about a week or so.    Prueba de Papanicolaou (Pap Test) POR QU ME DEBO REALIZAR ESTA PRUEBA? A esta prueba tambin se la denomina "frotis de Pap". Es una prueba de deteccin que se [place] Islandsutiliza para Engineer, manufacturingdetectar signos de cncer dKoreae vagina, cuello del tero y tero. La prueba tambin puede identificar la presencia de infeccin o cambios precancerosos. El mdico probablemente le recomiende que se realice esta prueba en forma regular. Esta prueba puede realizarse de la siguiente manera:  Cada 3 aos, a partir de los [address] aos.  Cada 5 aos, en combinacin con las pruebas que se realizan para Landscape architectdetectar la presencia del virus del Geneticist, molecularpapiloma humano (VPH).  Con mayor o menor frecuencia, en funcin de otras enfermedades que tenga. QU TIPO DE MUESTRA SE TOMA? El mdico utilizar un pequeo hisopo de algodn, una esptula de plstico o un cepillo para Landscape architectrecolectar una muestra de clulas de la superficie del cuello del tero. El cuello del tero es la apertura del tero, que tambin se conoce como matriz. Tambin pueden recolectarse las secreciones del cuello del tero y la vagina. CMO DEBO PREPARARME PARA ESTA PRUEBA?  Tenga en cuenta en qu etapa del ciclo menstrual se encuentra. Es posible que Human resources officerdeba reprogramar la prueba si est Magazine features editormenstruando el da en que debe realizrsela.  Si el da en que debe realizarse la prueba tiene una infeccin vaginal aparente, deber reprogramar la prueba.  Pueden pedirle que evite tomar una ducha o bao el da de la prueba o el da anterior.  Algunos medicamentos pueden The ServiceMaster Companyprovocar resultados anormales de la prueba, como los digitlicos y Regulatory affairs officerla tetraciclina. Si toma alguno de Ford Motor Companyestos medicamentos, hable con su mdico antes de Smurfit-Stone Containerrealizarse la prueba. QU SIGNIFICAN LOS RESULTADOS? Los  Cox Communicationsresultados anormales de la prueba pueden indicar diversas enfermedades. Estas pueden incluir lo siguiente:  Cncer. Si bien los resultados de la prueba de Papanicolaou no pueden utilizarse para Consulting civil engineerdiagnosticar cncer de cuello del tero, de vagina o de tero, pueden indicar que existe una posibilidad de presencia de cncer. En Maurilio Lovelyeste caso, ser necesario realizar pruebas adicionales para determinar la presencia de cncer.  Enfermedad de transmisin sexual.  Infecciones por hongos.  Infeccin por parsitos.  Infeccin por herpes.  Una enfermedad que causa o favorece la infertilidad. Es su responsabilidad retirar el resultado del Inaestudio. Consulte en el laboratorio o en el departamento en el que fue realizado el estudio cundo y cmo podr Starbucks Corporationobtener los resultados. Comunquese con el mdico si tiene Smith Internationalpreguntas sobre los resultados. Esta informacin no tiene Theme park managercomo fin reemplazar el consejo del mdico. Asegrese de hacerle al mdico cualquier pregunta que tenga. Document Released: 10/10/2007 Document Revised: 05/14/2014 Document Reviewed: 09/14/2013 Elsevier Interactive Patient Education  2017 ArvinMeritorElsevier Inc.

## 2016-06-04 NOTE — Progress Notes (Signed)
Pap Smear. °

## 2016-06-04 NOTE — Progress Notes (Signed)
Subjective:  Patient ID: Allison Cook, female    DOB: 1960-05-25  Age: 56 y.o. MRN: [id number]  CC: Patient here for PAP smear. UTI f/u. Varicose veins.  HPI Allison Crumbva Urquiza is a 56 y.o. female with a PMH of  Past Medical History:  Diagnosis Date  . Allergic rhinitis   . Anemia, iron deficiency    This has resolved  . Arthritis    Generalized, rx with nsaids.  Marland Kitchen. GERD (gastroesophageal reflux disease)   . Hypertension   . Irregular menstrual bleeding   . Otitis externa   . Systolic murmur 8/08   There is vigorous LV function. With this, there is slight increase in the gradient across the pulmonic valve.  . Varicose veins    that presents for PAP screening, f/u on UTI detected last week, and to request compression stockings for her varicose veins. Pt has taken her ciprofloxacin for UTI as indicated and states she is much better. No longer with dysuria or urinary frequency. When asked about vulvar abrasion, says she applied Desitin and is feeling much better. Requests a pair of compression stockings for her  Bilateral lower extremity. Lost her old compression stockings. Denies any history of STD, vaginal discharge, high risk sexual practices, or cervical cancer. Has a new female partner as of two years ago.  Outpatient Medications Prior to Visit  Medication Sig Dispense Refill  . aspirin (BUFFERIN LOW DOSE) 81 MG EC tablet Take 1 tablet (81 mg total) by mouth daily. 90 tablet 4  . ciprofloxacin (CIPRO) 500 MG tablet Take 1 tablet (500 mg total) by mouth 2 (two) times daily. 6 tablet 0  . ibuprofen (ADVIL,MOTRIN) 200 MG tablet Take 200 mg by mouth every 6 (six) hours as needed.    Marland Kitchen. lisinopril-hydrochlorothiazide (PRINZIDE,ZESTORETIC) 20-25 MG tablet Take 1 tablet by mouth daily. 90 tablet 4  . liver oil-zinc oxide (DESITIN) 40 % ointment Apply 1 application topically as needed for irritation. 56.7 g 0  . Vitamin D, Ergocalciferol, (DRISDOL) 50000 units CAPS capsule Take 1  capsule (50,000 Units total) by mouth every 7 (seven) days. 12 capsule 0  . acetaminophen (TYLENOL) 650 MG suppository Place 650 mg rectally every 8 (eight) hours as needed for fever.    . mupirocin ointment (BACTROBAN) 2 % Apply topically 3 (three) times daily. (Patient not taking: Reported on 04/09/2016) 22 g 0   No facility-administered medications prior to visit.     ROS Review of Systems  Constitutional: Negative for fatigue and fever.  Respiratory: Negative for shortness of breath.   Cardiovascular: Negative for chest pain.  Gastrointestinal: Negative for abdominal pain and nausea.  Genitourinary: Negative for flank pain, frequency, vaginal discharge and vaginal pain.  Musculoskeletal: Negative for back pain.  Skin: Negative for rash.  Neurological: Negative for headaches.    Objective:  BP 112/71 (BP Location: Left Arm, Patient Position: Sitting, Cuff Size: Large)   Pulse 70   Resp 16   Wt 192 lb 6.4 oz (87.3 kg)   LMP 10/16/2008   SpO2 98%   BMI 34.08 kg/m   BP/Weight 06/04/2016 05/28/2016 04/09/2016  Systolic BP 112 118 111  Diastolic BP 71 75 74  Wt. (Lbs) 192.4 196 200.4  BMI 34.08 34.72 35.5      Physical Exam  Constitutional: She is oriented to person, place, and time.  NAD, obese, polite  HENT:  Head: Normocephalic and atraumatic.  Eyes: Conjunctivae are normal. No scleral icterus.  Neck: Normal range of motion. Neck supple.  Pulmonary/Chest: Effort normal.  Abdominal: Soft.  Genitourinary:  Genitourinary Comments: Cluster of small, circular, erythematous, erosions on the labia minora bilaterally. Yellowish vaginal discharge. No odor. No bleeding, vaginal pallor, loss of rugae, cervicitis, cervical motion tenderness, adnexal pain, or abnormal uterine size.  Musculoskeletal: She exhibits no edema.  Neurological: She is alert and oriented to person, place, and time.  Skin: No rash noted. No erythema.  Multiple spider veins and small varicosities in the  lower extremity bilaterally.  Psychiatric: She has a normal mood and affect. Her behavior is normal.     Assessment & Plan:   1. Pap smear for cervical cancer screening  - Cytology - PAP [place]  2. Varicose veins of both lower extremities  - Compression stockings  3. 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 Will await PAP with ancillary testing for HSV1/2 - Cytology - PAP [place]  4. Acute cystitis without hematuria - Leukocyte from Large last week to Small today. No current symptoms. Small blood on dipstick likely from PAP collection. - Urinalysis - Glucose/Protein   No orders of the defined types were placed in this encounter.   Follow-up: Return in about 3 months (around 09/02/2016) for General physical.   Loletta Specter PA

## 2016-06-07 LAB — CYTOLOGY - PAP
Chlamydia: NEGATIVE
DIAGNOSIS: NEGATIVE
HPV: NOT DETECTED
NEISSERIA GONORRHEA: NEGATIVE

## 2016-06-07 NOTE — Progress Notes (Signed)
Negative for HPV and malignancy. Negative for CT/GC. I will call patient tomorrow with results.

## 2016-06-08 LAB — CERVICOVAGINAL ANCILLARY ONLY: Herpes: POSITIVE — AB

## 2016-06-08 NOTE — Progress Notes (Signed)
I spoke to patient and gave her the results of the PAP. Still awaiting Herpes culture.

## 2016-06-11 NOTE — Progress Notes (Signed)
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 However, I could not leave a message because she has not set up voicemail. Please try calling her one more time and if no answer, please send out result through the mail.

## 2016-06-26 ENCOUNTER — Encounter: Payer: Self-pay | Admitting: *Deleted

## 2016-09-20 ENCOUNTER — Encounter: Payer: Self-pay | Admitting: Internal Medicine

## 2016-09-21 ENCOUNTER — Encounter: Payer: Self-pay | Admitting: Internal Medicine

## 2016-09-24 ENCOUNTER — Encounter: Payer: Self-pay | Admitting: Internal Medicine

## 2017-07-01 ENCOUNTER — Ambulatory Visit: Payer: Self-pay | Attending: Physician Assistant

## 2017-07-23 ENCOUNTER — Encounter (HOSPITAL_COMMUNITY): Payer: Self-pay | Admitting: Emergency Medicine

## 2017-07-23 ENCOUNTER — Ambulatory Visit (HOSPITAL_COMMUNITY)
Admission: EM | Admit: 2017-07-23 | Discharge: 2017-07-23 | Disposition: A | Discharge status: Home / Self Care | Payer: Self-pay | Attending: Family Medicine | Admitting: Family Medicine

## 2017-07-23 ENCOUNTER — Other Ambulatory Visit: Payer: Self-pay

## 2017-07-23 DIAGNOSIS — I10 Essential (primary) hypertension: Secondary | ICD-10-CM

## 2017-07-23 DIAGNOSIS — Z760 Encounter for issue of repeat prescription: Secondary | ICD-10-CM

## 2017-07-23 MED ORDER — LISINOPRIL-HYDROCHLOROTHIAZIDE 20-25 MG PO TABS: 1.0000 | ORAL_TABLET | Freq: Every day | ORAL | 0 refills | Status: DC

## 2017-07-23 NOTE — ED Triage Notes (Signed)
Patient requesting medication refill.    Patient requesting lisinopril and hydrochlorothiazide 20/25, last dose was yesterday

## 2017-07-23 NOTE — ED Provider Notes (Signed)
[facility]    CSN: [id number] Arrival date & time: 07/23/17  1335     History   Chief Complaint Chief Complaint  Patient presents with  . Medication Refill    HPI Allison Cook is a 57 y.o. female.   57 year old female comes in for medication refill of her antihypertensives.  HPI obtained from patient through her granddaughter.  Patient has been on lisinopril-HCTZ for the past 18 years without problems.  Recently tried to follow-up with her PCP, and was told he had left the practice and will need to find new PCP.  She was told to call for PCP appointment end of March/early April.  Took her last pill yesterday. She is asymptomatic.       Past Medical History:  Diagnosis Date  . Allergic rhinitis   . Anemia, iron deficiency    This has resolved  . Arthritis    Generalized, rx with nsaids.  Marland Kitchen. GERD (gastroesophageal reflux disease)   . Hypertension   . Irregular menstrual bleeding   . Otitis externa   . Systolic murmur 8/08   There is vigorous LV function. With this, there is slight increase in the gradient across the pulmonic valve.  . Varicose veins     Patient Active Problem List   Diagnosis Date Noted  . Folliculitis 10/30/2012  . Left ovarian cyst 04/07/2012  . Pain in toe of right foot 03/22/2011  . Knee pain 10/25/2010  . Piriformis syndrome 10/24/2010  . Headache(784.0) 10/17/2010  . ALLERGIC RHINITIS 03/23/2010  . ARTHRITIS, GENERALIZED 04/20/2009  . VARICOSE VEINS, LOWER EXTREMITIES 01/15/2007  . HYPERTENSION 04/23/2006  . GERD 04/23/2006    Past Surgical History:  Procedure Laterality Date  . none      OB History    No data available       Home Medications    Prior to Admission medications   Medication Sig Start Date End Date Taking? Authorizing Provider  acetaminophen (TYLENOL) 650 MG suppository Place 650 mg rectally every 8 (eight) hours as needed for fever.    [provider]  aspirin (BUFFERIN LOW DOSE)  81 MG EC tablet Take 1 tablet (81 mg total) by mouth daily. 10/30/12   Dede QueryLi, Na, MD  ibuprofen (ADVIL,MOTRIN) 200 MG tablet Take 200 mg by mouth every 6 (six) hours as needed.    [provider]  lisinopril-hydrochlorothiazide (PRINZIDE,ZESTORETIC) 20-25 MG tablet Take 1 tablet by mouth daily. 07/23/17   Cathie HoopsYu, Bernice Mcauliffe V, PA-C  liver oil-zinc oxide (DESITIN) 40 % ointment Apply 1 application topically as needed for irritation. 05/28/16   Loletta SpecterGomez, Roger David, PA-C  mupirocin ointment (BACTROBAN) 2 % Apply topically 3 (three) times daily. Patient not taking: Reported on 04/09/2016 10/30/12   Dede QueryLi, Na, MD  Vitamin D, Ergocalciferol, (DRISDOL) 50000 units CAPS capsule Take 1 capsule (50,000 Units total) by mouth every 7 (seven) days. 04/10/16   Pete GlatterLangeland, Dawn T, MD    Family History Family History  Problem Relation Age of Onset  . Hypertension Mother   . Hypertension Father     Social History Social History   Tobacco Use  . Smoking status: Never Smoker  . Smokeless tobacco: Never Used  Substance Use Topics  . Alcohol use: No  . Drug use: No     Allergies   Tramadol   Review of Systems Review of Systems  Reason unable to perform ROS: See HPI as above.     Physical Exam Triage Vital Signs ED Triage Vitals [07/23/17  1435]  Enc Vitals Group     BP (!) 131/50     Pulse Rate 70     Resp 20     Temp 97.7 F (36.5 C)     Temp Source Oral     SpO2 100 %     Weight      Height      Head Circumference      Peak Flow      Pain Score      Pain Loc      Pain Edu?      Excl. in GC?    No data found.  Updated Vital Signs BP (!) 131/50 (BP Location: Left Arm) Comment (BP Location): large cuff  Pulse 70   Temp 97.7 F (36.5 C) (Oral)   Resp 20   LMP 10/16/2008   SpO2 100%   Physical Exam  Constitutional: She is oriented to person, place, and time. She appears well-developed and well-nourished. No distress.  HENT:  Head: Normocephalic and atraumatic.  Eyes: Conjunctivae are  normal. Pupils are equal, round, and reactive to light.  Cardiovascular: Normal rate, regular rhythm and normal heart sounds. Exam reveals no gallop and no friction rub.  No murmur heard. Pulmonary/Chest: Effort normal and breath sounds normal. She has no decreased breath sounds. She has no wheezes. She has no rhonchi. She has no rales.  Neurological: She is alert and oriented to person, place, and time.     UC Treatments / Results  Labs (all labs ordered are listed, but only abnormal results are displayed) Labs Reviewed - No data to display  EKG  EKG Interpretation None       Radiology No results found.  Procedures Procedures (including critical care time)  Medications Ordered in UC Medications - No data to display   Initial Impression / Assessment and Plan / UC Course  I have reviewed the triage vital signs and the nursing notes.  Pertinent labs & imaging results that were available during my care of the patient were reviewed by me and considered in my medical decision making (see chart for details).    Will refill lisinopril-HCTZ for 90 days while patient establish with PCP.  PCP assistance as well.  Return precautions given.  Patient and granddaughter expresses understanding and agrees to plan.  Final Clinical Impressions(s) / UC Diagnoses   Final diagnoses:  Medication refill    ED Discharge Orders        Ordered    lisinopril-hydrochlorothiazide (PRINZIDE,ZESTORETIC) 20-25 MG tablet  Daily     07/23/17 1457       Belinda Fisher, PA-C 07/23/17 1527

## 2017-07-23 NOTE — Discharge Instructions (Signed)
Medication refilled for 90 days. Please find new PCP to establish care.

## 2017-09-24 ENCOUNTER — Ambulatory Visit: Payer: Self-pay | Admitting: Medical

## 2017-10-15 ENCOUNTER — Ambulatory Visit (INDEPENDENT_AMBULATORY_CARE_PROVIDER_SITE_OTHER): Payer: Self-pay | Admitting: Family Medicine

## 2017-10-15 ENCOUNTER — Encounter: Payer: Self-pay | Admitting: Family Medicine

## 2017-10-15 VITALS — BP 110/82 | HR 64 | Temp 97.9°F | Ht 63.0 in | Wt 193.0 lb

## 2017-10-15 DIAGNOSIS — M1990 Unspecified osteoarthritis, unspecified site: Secondary | ICD-10-CM

## 2017-10-15 DIAGNOSIS — Z0000 Encounter for general adult medical examination without abnormal findings: Secondary | ICD-10-CM

## 2017-10-15 DIAGNOSIS — I10 Essential (primary) hypertension: Secondary | ICD-10-CM

## 2017-10-15 MED ORDER — LISINOPRIL-HYDROCHLOROTHIAZIDE 20-25 MG PO TABS: 1.0000 | ORAL_TABLET | Freq: Every day | ORAL | 1 refills | Status: DC

## 2017-10-15 NOTE — Progress Notes (Signed)
Patient presents to clinic today to establish care.  SUBJECTIVE: PMH: Pt is a 57 yo female with pmh sig for HTN and arthritis.  Pt was previously seen at [facility].  HTN: -Patient has been taking lisinopril-hydrochlorothiazide  20-25 mg daily -pt is not checking her bp at home. -drinking 2-3 bottles of water per day  Arthritis: -Patient notes history of arthritis in her right knee and then in her neck.  These have since resolved. -Patient now experiences arthritis in her hands. -Left hand more than right. -Patient notes swelling off and on in her left hand and inability at times to bend her left middle finger. -Patient takes Tylenol or Advil for the pain and massages her fingers. -Patient has not had x-rays of her hands.  Allergies: Tramadol-vomiting  Past surgical history: Ganglion cyst removal from right wrist.  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  Patient denies alcohol, tobacco, drug use.  Family medical history: Mom-HTN  Health Maintenance: Mammogram --2 or 3 years ago PAP --06/04/16   Past Medical History:  Diagnosis Date  . Allergic rhinitis   . Anemia, iron deficiency    This has resolved  . Arthritis    Generalized, rx with nsaids.  Marland Kitchen GERD (gastroesophageal reflux disease)   . Hypertension   . Irregular menstrual bleeding   . Otitis externa   . Systolic murmur 8/08   There is vigorous LV function. With this, there is slight increase in the gradient across the pulmonic valve.  . Varicose veins     Past Surgical History:  Procedure Laterality Date  . none      Current Outpatient Medications on File Prior to Visit  Medication Sig Dispense Refill  . acetaminophen (TYLENOL) 650 MG suppository Place 650 mg rectally every 8 (eight) hours as needed for fever.    Marland Kitchen ibuprofen (ADVIL,MOTRIN) 200 MG tablet Take 200 mg by mouth every 6 (six) hours as needed.    Marland Kitchen  lisinopril-hydrochlorothiazide (PRINZIDE,ZESTORETIC) 20-25 MG tablet Take 1 tablet by mouth daily. 90 tablet 0  . Vitamin D, Ergocalciferol, (DRISDOL) 50000 units CAPS capsule Take 1 capsule (50,000 Units total) by mouth every 7 (seven) days. 12 capsule 0   No current facility-administered medications on file prior to visit.     Allergies  Allergen Reactions  . Tramadol Nausea And Vomiting    Family History  Problem Relation Age of Onset  . Hypertension Mother     Social History   Socioeconomic History  . Marital status: Single    Spouse name: Not on file  . Number of children: 0  . Years of education: Not on file  . Highest education level: Not on file  Occupational History  . Occupation: hotel housekeeper  Social Needs  . Financial resource strain: Not on file  . Food insecurity:    Worry: Not on file    Inability: Not on file  . Transportation needs:    Medical: Not on file    Non-medical: Not on file  Tobacco Use  . Smoking status: Never Smoker  . Smokeless tobacco: Never Used  Substance and Sexual Activity  . Alcohol use: No  . Drug use: No  . Sexual activity: Not Currently  Lifestyle  . Physical activity:    Days per week: Not on file    Minutes per session: Not on file  . Stress: Not on file  Relationships  . Social connections:  Talks on phone: Not on file    Gets together: Not on file    Attends religious service: Not on file    Active member of club or organization: Not on file    Attends meetings of clubs or organizations: Not on file    Relationship status: Not on file  . Intimate partner violence:    Fear of current or ex partner: Not on file    Emotionally abused: Not on file    Physically abused: Not on file    Forced sexual activity: Not on file  Other Topics Concern  . Not on file  Social History Narrative   Single immigrated from [place] in 1999. Lives at home by self. Works as a Advertising copywriterhousekeeper in a hotel.     ROS General: Denies  fever, chills, night sweats, changes in weight, changes in appetite HEENT: Denies headaches, ear pain, changes in vision, rhinorrhea, sore throat CV: Denies CP, palpitations, SOB, orthopnea Pulm: Denies SOB, cough, wheezing GI: Denies abdominal pain, nausea, vomiting, diarrhea, constipation GU: Denies dysuria, hematuria, frequency, vaginal discharge Msk: Denies muscle cramps, joint pains  +hand pain Neuro: Denies weakness, numbness, tingling Skin: Denies rashes, bruising Psych: Denies depression, anxiety, hallucinations  BP 110/82 (BP Location: Left Arm, Patient Position: Sitting, Cuff Size: Large)   Pulse 64   Temp 97.9 F (36.6 C) (Oral)   Ht 5\' 3"  (1.6 m)   Wt 193 lb (87.5 kg)   LMP 10/16/2008   SpO2 98%   BMI 34.19 kg/m   Physical Exam Gen. Pleasant, well developed, well-nourished, in NAD HEENT - [place]/AT, PERRL, no scleral icterus, no nasal drainage, pharynx without erythema or exudate.  TMs normal bilaterally.  No cervical lymphadenopathy. Neck: No JVD, no thyromegaly, no carotid bruits Lungs: no use of accessory muscles, CTAB, no wheezes, rales or rhonchi Cardiovascular: RRR, No r/g/m, no peripheral edema Abdomen: BS present, soft, nontender, nondistended Musculoskeletal: No deformities, moves all four extremities, no cyanosis or clubbing, normal tone.  Edema of palmar surface of L hand at metacarpal phalangeal joints.  L 3rd digit stiff unable to fully flex. Neuro:  A&Ox3, CN II-XII intact, normal gait Skin:  Warm, dry, intact, no lesions Psych: normal affect, mood appropriate  No results found for this or any previous visit (from the past 2160 hour(s)).  Assessment/Plan: Well adult exam Anticipatory guidance given including wearing seatbelts, smoke detectors in the home, increasing physical activity, increasing p.o. intake of water and vegetables. -pt declines labs at this time -pap up to date -next CPE in 1 yr  Essential hypertension -Controlled  - Plan:  lisinopril-hydrochlorothiazide (PRINZIDE,ZESTORETIC) 20-25 MG tablet -Patient encouraged to check blood pressure at home and to increase p.o. intake of water -Should obtain BMP at next OFV  Arthritis -Continue Advil sparingly -Can try heat, massage -Given handout -Offered x-ray this visit of left hand, patient declines at this time -We will reevaluate at each visit  Follow-up PRN  Abbe AmsterdamShannon Velta [place], MD

## 2017-10-15 NOTE — Patient Instructions (Signed)
Artritis (Arthritis) El trmino artritis se usa comnmente para hacer referencia al dolor de las articulaciones o a la enfermedad articular. Hay ms de 100tipos de artritis. CAUSAS La causa ms frecuente de esta afeccin es el desgaste de una articulacin. Algunas otras causas son las siguientes:  Gota.  Inflamacin de una articulacin.  Una infeccin de una articulacin.  Esguinces y otras lesiones cerca de la articulacin.  Una reaccin farmacolgica o alrgica. En algunos casos, es posible que la causa no se conozca. SNTOMAS El sntoma principal de esta afeccin es el dolor de la articulacin con el movimiento. Otros sntomas pueden ser los siguientes:  Enrojecimiento, hinchazn o rigidez de una articulacin.  Calor que emana de la articulacin.  Fiebre.  Sensacin generalizada de estar enfermo. DIAGNSTICO Esta afeccin se puede diagnosticar mediante un examen fsico y estudios, entre ellos:  Anlisis de sangre.  Anlisis de orina.  Estudios de diagnstico por imgenes, como una resonancia magntica (RM), radiografas o una tomografa computarizada (TC). A veces, se extrae lquido de una articulacin para analizarlo. TRATAMIENTO El tratamiento de esta afeccin puede incluir lo siguiente:  El tratamiento de la causa, si se conoce.  Reposo.  Mantener elevada la articulacin.  Aplicar compresas fras o calientes en la articulacin.  Medicamentos para aliviar los sntomas y reducir la inflamacin.  Inyecciones de un corticoide, como cortisona, en la articulacin para ayudar a aliviar el dolor y reducir la inflamacin. Segn la causa de la artritis, tal vez haya que hacer cambios en el estilo de vida para reducir la carga sobre la articulacin. Algunos de los cambios incluyen realizar ms actividad fsica y bajar de peso, entre otros. INSTRUCCIONES PARA EL CUIDADO EN EL HOGAR Medicamentos  Tome los medicamentos de venta libre y los recetados solamente como se lo  haya indicado el mdico.  No tome aspirina para aliviar el dolor si se sospecha la presencia de gota. Actividades  Ponga en reposo la articulacin como se lo haya indicado el mdico. El reposo es importante cuando la enfermedad est activa y la articulacin le duele, est hinchada o rgida.  Evite las actividades que intensifiquen el dolor. Es importante encontrar el equilibrio entre la actividad y el reposo.  Con frecuencia, realice ejercicios de flexibilidad articular como se lo haya indicado el mdico. Intente realizar ejercicios de bajo impacto, por ejemplo: ? Natacin. ? Gimnasia acutica. ? Andar en bicicleta. ? Caminar. Cuidado de la articulacin  Si la articulacin se le hincha, mantngala elevada como se lo haya indicado el mdico.  Si al despertar por la maana, nota que la articulacin est rgida, intente tomar una ducha con agua tibia.  Si se lo indican, pngase calor en la articulacin. Si es diabtico, no se aplique calor sin la autorizacin del mdico. ? Coloque una toalla entre la articulacin y la compresa caliente o la almohadilla trmica. ? Coloque el calor en la zona durante 20 o 30minutos.  Si se lo indican, pngase hielo en la articulacin: ? Ponga el hielo en una bolsa plstica. ? Coloque una toalla entre la piel y la bolsa de hielo. ? Coloque el hielo durante 20minutos, 2 a 3veces por da.  Concurra a todas las visitas de control como se lo haya indicado el mdico. Esto es importante. SOLICITE ATENCIN MDICA SI:  El dolor empeora.  Tiene fiebre. SOLICITE ATENCIN MDICA DE INMEDIATO SI:  Siente dolor, u observa hinchazn o enrojecimiento en la articulacin.  Siente dolor en muchas articulaciones y se le hinchan.  Siente un dolor   intenso en la espalda.  Tiene mucha debilidad en la pierna.  No puede controlar los intestinos o la vejiga. Esta informacin no tiene Theme park managercomo fin reemplazar el consejo del mdico. Asegrese de hacerle al mdico cualquier  pregunta que tenga. Document Released: 04/23/2005 Document Revised: 08/15/2015 Document Reviewed: 07/19/2014 Elsevier Interactive Patient Education  2018 Elsevier Inc.  Cmo tomarse la presin arterial How to Take Your Blood Pressure Usted puede tomar su presin arterial en casa con un aparato. Es posible que tenga que tomar su presin arterial en casa:  Para ver si tiene presin arterial elevada (hipertensin).  Para controlar su presin arterial a lo largo del tiempo.  Para asegurarse de que el medicamento para la presin arterial est funcionando.  Materiales necesarios: Pension scheme managerecesitar un aparato de medicin de la presin arterial o tensimetro. Puede comprar uno en una farmacia o en lnea. Al escoger uno:  Escoja uno que tenga un brazalete.  Escoja uno que se envuelva en la parte superior de su brazo. Debe poder meter nicamente un dedo entre el brazalete y Cabin crewel brazo.  No escoja uno que mida su presin arterial en la mueca o el dedo.  Su mdico puede sugerirle un tensimetro. Cmo prepararse Evite realizar lo siguiente durante los 30 minutos anteriores a Chief Operating Officercontrolar su presin arterial:  Pharmacist, hospitalBeber cafena.  Consumir alcohol.  Comer.  Fumar.  Realizar actividad fsica.  Cinco minutos antes de controlar su presin arterial:  Orine.  Sintese en una silla de comedor. Evite sentarse en un silln blando o sof.  Est tranquilo. No hable.  Cmo tomarse la presin arterial Siga las instrucciones que vienen con el aparato. Si tiene Ambulance personun tensimetro digital, las instrucciones podran ser las siguientes: 1. Sintese con la espalda recta. 2. Coloque los pies en el piso. No cruce los tobillos o las piernas. 3. Apoye el brazo izquierdo al nivel del corazn. Puede apoyarlo en una mesa, escritorio o silla. 4. Arremnguese. 5. Envuelva la parte superior de su brazo izquierdo con el brazalete. El brazalete debe estar 1 pulgada (2,5 cm) sobre su codo. Es mejor Optometristenvolver el brazalete  alrededor de la piel Di Giorgiodesnuda. 6. Ajuste el brazalete alrededor de su brazo. Debe poder meter nicamente un dedo entre el brazalete y Cabin crewel brazo. 7. Coloque el cable dentro del surco de su codo. 8. Presione el botn de encendido. 9. Qudese sentado tranquilamente mientras el brazalete se infla y se desinfla. 10. Escriba los nmeros que se muestran en la pantalla. 11. Espere 2o 3 minutos y repita los pasos 1al10.  Qu significan los nmeros? Dos nmeros conforman la presin arterial. El primer nmero es la presin sistlica. El segundo nmero es la presin diastlica. Un ejemplo de lectura de presin arterial sera "120 sobre 80" (o 120/80). Si es adulto y no tiene Therapist, musicninguna afeccin, use esta gua para saber si su presin arterial es normal: Normal  Primer nmero: debajo de 120.  Segundo nmero: debajo de 80. Elevada  Primer nmero: 120-129.  Segundo nmero: debajo de 80. Etapa 1 de hipertensin  Primer nmero: 130-139.  Segundo nmero: 80-89. Etapa 2 de hipertensin  Primer nmero: 140 o ms.  Segundo nmero: 90 o ms. Su presin arterial se encuentra por encima del nivel normal incluso si nicamente el nmero superior o el inferior es mayor que el normal. Siga estas instrucciones en su casa:  Controle su presin arterial con la frecuencia que le indique su mdico.  Lleve el tensimetro a su prxima cita con el mdico. Su mdico: ? Se asegurar  de que lo est usando correctamente. ? Se asegurar de que funcione bien.  Se asegurar de que entienda cules deben ser sus nmeros de presin arterial.  Dgale al mdico si sus medicamentos le causan efectos secundarios. Comunquese con un mdico si:  Su presin arterial sigue alta. Solicite ayuda de inmediato si:  Su primer nmero de presin arterial es ms alto que 180.  Su segundo nmero de presin arterial es ms alto que 120. Esta informacin no tiene Theme park manager el consejo del mdico. Asegrese de hacerle al  mdico cualquier pregunta que tenga. Document Released: 08/08/2010 Document Revised: 04/04/2016 Document Reviewed: 09/30/2015 Elsevier Interactive Patient Education  2018 Elsevier Inc.  Cmo controlar su hipertensin Managing Your Hypertension La hipertensin se denomina usualmente presin arterial alta. Ocurre cuando la sangre presiona contra las paredes de las arterias con demasiada fuerza. Las arterias son los vasos sanguneos que transportan la sangre desde el corazn hacia todas las partes del cuerpo. La hipertensin hace que el corazn haga ms esfuerzo para Insurance account manager y [employer] que las arterias se Armed forces training and education officer o Multimedia programmer. La hipertensin no tratada o no controlada puede causar infarto de miocardio, accidente cerebrovascular, enfermedad renal y otros problemas. Qu son las Merchandiser, retail de presin arterial? Una lectura de la presin arterial consiste de un nmero ms alto sobre un nmero ms bajo. En condiciones ideales, la presin arterial debe estar por debajo de 120/80. El primer nmero ("superior") es la presin sistlica. Es la medida de la presin de las arterias cuando el corazn late. El segundo nmero ("inferior") es la presin diastlica. Es la medida de la presin en las arterias cuando el corazn se relaja. Qu significa mi lectura de presin arterial? La presin arterial se clasifica en cuatro etapas. Sobre la base de la lectura de su presin arterial, el mdico puede usar las siguientes etapas para determinar si necesita tratamiento y de qu tipo. La presin sistlica y la presin diastlica se miden en una unidad llamada mm Hg. Normal  Presin sistlica: por debajo de 120.  Presin diastlica: por debajo de 80. Elevada  Presin sistlica: 120-129.  Presin diastlica: por debajo de 80. Etapa 1 de hipertensin  Presin sistlica: 130-139.  Presin diastlica: 80-89. Etapa 2 de hipertensin  Presin sistlica: 140 o ms.  Presin diastlica: 90 o ms. Cules  son los riesgos para la salud asociados con la hipertensin? Controlar la hipertensin es una responsabilidad importante. La hipertensin no controlada puede causar:  Infarto de miocardio.  Accidente cerebrovascular.  Debilitamiento de los vasos sanguneos (aneurisma).  Insuficiencia cardaca.  Dao renal.  Dao ocular.  Sndrome metablico.  Problemas de memoria y concentracin.  Qu cambios puedo hacer para controlar mi hipertensin? La hipertensin se puede controlar haciendo [employer] estilo de vida y, posiblemente, tomando medicamentos. Su mdico le ayudar a crear un plan para bajar la presin arterial al rango normal. Comida y bebida  Siga una dieta con alto contenido de fibras y [place], y con bajo contenido de sal (sodio), azcar agregada y Rosalin Hawking. Un ejemplo de plan alimenticio es la dieta DASH (Dietary Approaches to Stop Hypertension, Mtodos alimenticios para detener la hipertensin). Para alimentarse de esta manera: ? Coma mucha fruta y verdura fresca. Trate de que la mitad del plato de cada comida sea de frutas y verduras. ? Coma cereales integrales, como pasta integral, arroz integral y pan integral. Llene aproximadamente un cuarto del plato con cereales integrales. ? Consuma productos lcteos con bajo contenido de grasa. ? Evite la ingesta  de cortes de carne grasa, carne procesada o curada, y carne de ave con piel. Llene aproximadamente un cuarto del plato con protenas magras, como pescado, pollo sin piel, frijoles, huevos y tofu. ? Evite ingerir alimentos prehechos o procesados. En general, estos tienen mayor cantidad de sodio, azcar agregada y Steffanie [place].  Reduzca su ingesta diaria de sodio. La mayora de las personas que tienen hipertensin deben comer menos de 1500 mg de sodio por [employer].  Limite el consumo de alcohol a no ms de 1 medida por da si es mujer y no est Orthoptist y a 2 medidas por da si es hombre. Una medida equivale a 12onzas de cerveza, 5onzas  de vino o 1onzas de bebidas alcohlicas de alta graduacin. Estilo de vida  Trabaje con su mdico para mantener un peso saludable o Curator. Pregntele cual es su peso recomendado.  Realice al menos 30 minutos de ejercicio que haga que se acelere su corazn (ejercicio Magazine features editor) la [employer] de la [place]. Estas actividades pueden incluir caminar, nadar o andar en bicicleta.  Incluya ejercicios para fortalecer sus msculos (ejercicios de resistencia), como levantamiento de pesas, como parte de su rutina semanal de ejercicios. Intente realizar de este tipo de ejercicios al Kellogg a la [place].  No consuma ningn producto que contenga nicotina o tabaco, como cigarrillos y Administrator, Civil Service. Si necesita ayuda para dejar de fumar, consulte al [employer].  Controle las enfermedades a largo plazo (crnicas), como el colesterol alto o la diabetes. Control  Contrlese la presin arterial en su casa segn las indicaciones del mdico. La presin arterial deseada puede variar en funcin de las enfermedades, la edad y otros factores personales.  Contrlese la presin arterial de manera regular, en la frecuencia indicada por su mdico. Trabaje con su mdico  Revise con su mdico todos los medicamentos que toma ya que puede haber efectos secundarios o interacciones.  Hable con su mdico acerca de la dieta, hbitos de ejercicio y otros factores del estilo de vida que pueden contribuir a la hipertensin.  Consulte a su mdico regularmente. Su mdico puede ayudarle a crear y [place] su plan para controlar la hipertensin. Debo tomar un medicamento para controlar mi presin arterial? El mdico puede recetarle medicamentos si los cambios en el estilo de vida no son suficientes para Museum/gallery curator la presin arterial y si:  Su presin arterial sistlica es de 130 o ms.  Su presin arterial diastlica es de 80 o ms.  Tome los medicamentos solamente como se lo haya  indicado el mdico. Siga cuidadosamente las indicaciones. Los medicamentos para la presin arterial deben tomarse segn las indicaciones. Los medicamentos pierden eficacia al omitir las dosis. El hecho de omitir las dosis tambin [place] el riesgo de otros problemas. Comunquese con un mdico si:  Piensa que tiene Runner, broadcasting/film/video a los medicamentos que ha tomado.  Tiene dolores de cabeza frecuentes (recurrentes).  Siente mareos.  Tiene hinchazn en los tobillos.  Tiene problemas de visin. Solicite ayuda de inmediato si:  Siente un dolor de cabeza intenso o confusin.  Siente debilidad inusual, adormecimiento o que Hospital doctor.  Siente un dolor intenso en el pecho o el abdomen.  Vomita repetidas veces.  Tiene dificultad para respirar. Resumen  La hipertensin se produce cuando la sangre bombea en las arterias con mucha fuerza. Si esta afeccin no se controla, podra correr riesgo de tener complicaciones graves.  La presin arterial deseada puede variar en funcin de las enfermedades, la edad  y otros factores personales. Para la [employer], una presin arterial normal es menor que 120/80.  La hipertensin se puede controlar mediante cambios en el estilo de vida, tomando medicamentos, o ambas cosas. Los [employer] estilo de vida incluyen prdida de peso, ingerir alimentos sanos, seguir una dieta baja en sodio, hacer ms ejercicio y Glass blower/designer consumo de alcohol. Esta informacin no tiene Theme park manager el consejo del mdico. Asegrese de hacerle al mdico cualquier pregunta que tenga. Document Released: 01/16/2012 Document Revised: 04/04/2016 Document Reviewed: 04/04/2016 Elsevier Interactive Patient Education  [employer].

## 2018-05-03 ENCOUNTER — Other Ambulatory Visit: Payer: Self-pay | Admitting: Family Medicine

## 2018-05-03 DIAGNOSIS — I10 Essential (primary) hypertension: Secondary | ICD-10-CM

## 2018-05-05 NOTE — Telephone Encounter (Signed)
Pt daughter called and stated that pt is completley out of medication. Please advise

## 2018-07-30 ENCOUNTER — Other Ambulatory Visit: Payer: Self-pay | Admitting: Family Medicine

## 2018-07-30 DIAGNOSIS — I10 Essential (primary) hypertension: Secondary | ICD-10-CM

## 2018-07-30 MED ORDER — LISINOPRIL-HYDROCHLOROTHIAZIDE 20-25 MG PO TABS: 1.0000 | ORAL_TABLET | Freq: Every day | ORAL | 0 refills | Status: DC

## 2018-11-28 ENCOUNTER — Other Ambulatory Visit: Payer: Self-pay | Admitting: Family Medicine

## 2018-11-28 DIAGNOSIS — I10 Essential (primary) hypertension: Secondary | ICD-10-CM

## 2018-12-18 ENCOUNTER — Ambulatory Visit: Payer: Self-pay | Admitting: Family Medicine

## 2019-01-07 ENCOUNTER — Telehealth: Payer: Self-pay | Admitting: Family Medicine

## 2019-01-07 NOTE — Telephone Encounter (Signed)
Pts daughter called to schedule a med f/u for a refill on her lisinopril-hydrochlorothiazide (ZESTORETIC) 20-25 MG tablet  But Dr. Volanda Napoleon first available was not until 9.24.20/ Pt schedule that appt for a phone or virtual visit but will need a courtesy refill for the Lisinopril until that date. Pt has about 10 pills left but will run out before her appointment/ please advise

## 2019-01-07 NOTE — Telephone Encounter (Signed)
Message routed to Dr. Volanda Napoleon CMA.

## 2019-01-08 ENCOUNTER — Other Ambulatory Visit: Payer: Self-pay

## 2019-01-08 DIAGNOSIS — I10 Essential (primary) hypertension: Secondary | ICD-10-CM

## 2019-01-08 MED ORDER — LISINOPRIL-HYDROCHLOROTHIAZIDE 20-25 MG PO TABS: 1.0000 | ORAL_TABLET | Freq: Every day | ORAL | 0 refills | Status: DC

## 2019-01-08 NOTE — Telephone Encounter (Signed)
Rx sent 

## 2019-01-29 ENCOUNTER — Encounter: Payer: Self-pay | Admitting: Family Medicine

## 2019-01-29 ENCOUNTER — Ambulatory Visit (INDEPENDENT_AMBULATORY_CARE_PROVIDER_SITE_OTHER): Payer: Self-pay | Admitting: Family Medicine

## 2019-01-29 ENCOUNTER — Other Ambulatory Visit: Payer: Self-pay

## 2019-01-29 DIAGNOSIS — I10 Essential (primary) hypertension: Secondary | ICD-10-CM

## 2019-01-29 LAB — BASIC METABOLIC PANEL
BUN: 22 mg/dL (ref 6–23)
CO2: 31 mEq/L (ref 19–32)
Calcium: 10.3 mg/dL (ref 8.4–10.5)
Chloride: 97 mEq/L (ref 96–112)
Creatinine, Ser: 0.79 mg/dL (ref 0.40–1.20)
GFR: 74.77 mL/min (ref >60.00)
Glucose, Bld: 109 mg/dL — ABNORMAL HIGH (ref 70–99)
Potassium: 4.1 mEq/L (ref 3.5–5.1)
Sodium: 136 mEq/L (ref 135–145)

## 2019-01-29 MED ORDER — LISINOPRIL-HYDROCHLOROTHIAZIDE 20-25 MG PO TABS: 1.0000 | ORAL_TABLET | Freq: Every day | ORAL | 3 refills | Status: DC

## 2019-01-29 NOTE — Progress Notes (Signed)
Virtual Visit via Video Note  I connected with Allison Cook on 01/29/19 at 10:00 AM EDT by a video enabled telemedicine application 2/2 [id number] pandemic and verified that I am speaking with the correct person using two identifiers.  Location patient: home Location provider:work or home office Persons participating in the virtual visit: patient, provider, pt's granddaughter present  I discussed the limitations of evaluation and management by telemedicine and the availability of in person appointments. The patient expressed understanding and agreed to proceed.   HPI: Pt is a 21 female with pmh sig for HTN.  Taking lisinopril-HCTZ 20-25 mg daily.  Denies HAs, CP, changes in vision.  Not checking bp at home.  Not adding extra salt to food.   ROS: See pertinent positives and negatives per HPI.  Past Medical History:  Diagnosis Date  . Allergic rhinitis   . Anemia, iron deficiency    This has resolved  . Arthritis    Generalized, rx with nsaids.  Marland Kitchen GERD (gastroesophageal reflux disease)   . Hypertension   . Irregular menstrual bleeding   . Otitis externa   . Systolic murmur 4/85   There is vigorous LV function. With this, there is slight increase in the gradient across the pulmonic valve.  . Varicose veins     Past Surgical History:  Procedure Laterality Date  . none      Family History  Problem Relation Age of Onset  . Hypertension Mother       Current Outpatient Medications:  .  acetaminophen (TYLENOL) 650 MG suppository, Place 650 mg rectally every 8 (eight) hours as needed for fever., Disp: , Rfl:  .  ibuprofen (ADVIL,MOTRIN) 200 MG tablet, Take 200 mg by mouth every 6 (six) hours as needed., Disp: , Rfl:  .  lisinopril-hydrochlorothiazide (ZESTORETIC) 20-25 MG tablet, Take 1 tablet by mouth daily., Disp: 30 tablet, Rfl: 0 .  Vitamin D, Ergocalciferol, (DRISDOL) 50000 units CAPS capsule, Take 1 capsule (50,000 Units total) by mouth every 7 (seven) days., Disp:  12 capsule, Rfl: 0  EXAM:  VITALS per patient if applicable:  GENERAL: alert, oriented, appears well and in no acute distress  HEENT: atraumatic, conjunctiva clear, no obvious abnormalities on inspection of external nose and ears  NECK: normal movements of the head and neck  LUNGS: on inspection no signs of respiratory distress, breathing rate appears normal, no obvious gross SOB, gasping or wheezing  CV: no obvious cyanosis  MS: moves all visible extremities without noticeable abnormality  PSYCH/NEURO: pleasant and cooperative, no obvious depression or anxiety, speech and thought processing grossly intact  ASSESSMENT AND PLAN:  Discussed the following assessment and plan:  Essential hypertension  -pt advised to check bp at home -pt to come in for labs today - Plan: lisinopril-hydrochlorothiazide (ZESTORETIC) 20-25 MG tablet, Basic metabolic panel  F/u prn in the next 3-6 months   I discussed the assessment and treatment plan with the patient. The patient was provided an opportunity to ask questions and all were answered. The patient agreed with the plan and demonstrated an understanding of the instructions.   The patient was advised to call back or seek an in-person evaluation if the symptoms worsen or if the condition fails to improve as anticipated.  I provided 11 minutes of non-face-to-face time during this encounter.   Billie Ruddy, MD

## 2019-01-30 NOTE — Addendum Note (Signed)
Addended by: Grier Mitts R on: 01/30/2019 10:17 PM   Modules accepted: Level of Service

## 2020-03-07 ENCOUNTER — Other Ambulatory Visit: Payer: Self-pay

## 2020-03-07 ENCOUNTER — Ambulatory Visit (INDEPENDENT_AMBULATORY_CARE_PROVIDER_SITE_OTHER): Payer: Self-pay

## 2020-03-07 ENCOUNTER — Encounter: Payer: Self-pay | Admitting: Family Medicine

## 2020-03-07 ENCOUNTER — Ambulatory Visit: Payer: Self-pay | Admitting: Family Medicine

## 2020-03-07 VITALS — BP 122/78 | HR 62 | Temp 97.9°F | Wt 198.2 lb

## 2020-03-07 DIAGNOSIS — R202 Paresthesia of skin: Secondary | ICD-10-CM

## 2020-03-07 DIAGNOSIS — Z23 Encounter for immunization: Secondary | ICD-10-CM

## 2020-03-07 DIAGNOSIS — M79642 Pain in left hand: Secondary | ICD-10-CM

## 2020-03-07 DIAGNOSIS — Z0001 Encounter for general adult medical examination with abnormal findings: Secondary | ICD-10-CM

## 2020-03-07 DIAGNOSIS — Z0000 Encounter for general adult medical examination without abnormal findings: Secondary | ICD-10-CM

## 2020-03-07 DIAGNOSIS — I10 Essential (primary) hypertension: Secondary | ICD-10-CM

## 2020-03-07 MED ORDER — LISINOPRIL-HYDROCHLOROTHIAZIDE 20-25 MG PO TABS: 1.0000 | ORAL_TABLET | Freq: Every day | ORAL | 3 refills | Status: DC

## 2020-03-07 NOTE — Patient Instructions (Signed)
Cuidados preventivos en las mujeres de 27 a 76 aos de edad Preventive Care 31-59 Years Old, Female Los cuidados preventivos hacen referencia a las opciones en cuanto a las visitas al mdico y al estilo de vida, las cuales pueden promover la salud y Musician. Esto puede comprender lo siguiente:  Un examen fsico anual. Esto tambin se puede llamar control de bienestar anual.  Visitas regulares al dentista y exmenes oculares.  Vacunas.  Estudios para Engineer, building services.  Opciones saludables de estilo de vida, como seguir una dieta saludable, hacer ejercicio regularmente, no usar drogas ni productos que contengan nicotina y tabaco, y limitar el consumo de alcohol. Qu puedo esperar para mi visita de cuidado preventivo? Examen fsico El mdico revisar lo siguiente:  IT consultant y [place]. Esto se puede usar para calcular el ndice de masa corporal ([place]), que indica si tiene un peso saludable.  Frecuencia cardaca y presin arterial.  Piel para detectar manchas anormales. Asesoramiento Su mdico puede preguntarle acerca de:  Consumo de tabaco, alcohol y drogas.  Su bienestar emocional.  El bienestar en el hogar y sus relaciones personales.  Su actividad sexual.  Sus hbitos de alimentacin.  Su trabajo y [place] laboral.  Mtodos anticonceptivos.  Su ciclo menstrual.  Sus antecedentes de Media planner. Qu vacunas necesito?  [place] antigripal  Se recomienda aplicarse esta vacuna todos los Rudy. Vacuna contra el ttanos, difteria y tos ferina (Tdap)  Es posible que tenga que aplicarse un refuerzo contra el ttanos y la difteria (DT) cada 10aos. Vacuna contra la varicela  Es posible que tenga que aplicrsela si no recibi esta vacuna. Vacuna contra el herpes zster (culebrilla)  Es posible que la necesite despus de los 52 aos de [place]. Vacuna contra el sarampin, rubola y paperas (SRP)  Es posible que necesite aplicarse al menos una dosis de la vacuna  SRP si naci despus de [phone]. Podra tambin necesitar una segunda dosis. Vacuna antineumoccica conjugada (PCV13)  Puede necesitar esta vacuna si tiene determinadas enfermedades y no se vacun anteriormente. Edward Jolly antineumoccica de polisacridos (PPSV23)  Quizs tenga que aplicarse una o dos dosis si fuma o si tiene determinadas afecciones. Edward Jolly antimeningoccica conjugada (MenACWY)  Puede necesitar esta vacuna si tiene determinadas afecciones. Vacuna contra la hepatitis A  Es posible que necesite esta vacuna si tiene ciertas afecciones o si viaja o trabaja en lugares en los que podra estar expuesta a la hepatitis A. Vacuna contra la hepatitis B  Es posible que necesite esta vacuna si tiene ciertas afecciones o si viaja o trabaja en lugares en los que podra estar expuesta a la hepatitis B. Vacuna antihaemophilus influenzae tipo B (Hib)  Puede necesitar esta vacuna si tiene determinadas afecciones. Vacuna contra el virus del papiloma humano (VPH)  Si el mdico se lo recomienda, Research scientist (physical sciences) tres dosis a lo largo de 6 meses. Puede recibir las vacunas en forma de dosis individuales o en forma de dos o ms vacunas juntas en la misma inyeccin (vacunas combinadas). Hable con su mdico [employer] y beneficios de las vacunas combinadas. Qu pruebas necesito? Anlisis de [employer] de lpidos y colesterol. Estos se pueden verificar cada 5 aos o, con ms frecuencia, si usted tiene ms de 42 aos de edad.  Anlisis de hepatitisC.  Anlisis de hepatitisB. Pruebas de deteccin  Pruebas de deteccin de cncer de pulmn. Es posible que se le realice esta prueba de deteccin a partir de los 2 aos de edad, si ha fumado durante 30 aos  un paquete diario y sigue fumando o dej el hbito en algn momento en los ltimos 15 aos.  Pruebas de Programme researcher, broadcasting/film/video de Surveyor, minerals. Todos los adultos a partir de los 3 aos de edad y [place] 18 aos de edad deben hacerse esta  prueba de deteccin. El mdico puede recomendarle las pruebas de deteccin a partir de los 64 aos de edad si corre un mayor riesgo. Le realizarn pruebas cada 1 a 10 aos, segn los Loraine y el tipo de prueba de Programme researcher, broadcasting/film/video.  Pruebas de deteccin de la diabetes. Esto se Set designer un control del azcar en la sangre (glucosa) despus de no haber comido durante un periodo de tiempo (ayuno). Es posible que se le realice esta prueba cada 1 a 3 aos.  Mamografa. Se puede realizar cada 1 o 2 aos. Hable con su mdico sobre cundo debe comenzar a Engineer, manufacturing de [place] regular. Esto depende de si tiene antecedentes familiares de cncer de mama o no.  Pruebas de deteccin de cncer relacionado con las mutaciones del BRCA. Es posible que se las deba realizar si tiene antecedentes de cncer de mama, de ovario, de trompas o peritoneal.  Examen plvico y prueba de Papanicolaou. Esto se puede realizar cada 8aos a Renato Gails de los 21aos de edad. A partir de los 30 aos, esto se puede Optometrist cada 5 aos si usted se realiza una prueba de Papanicolaou en combinacin con una prueba de deteccin del virus del papiloma humano (VPH). Otras pruebas  Anlisis de enfermedades de transmisin sexual (ETS).  Densitometra sea. Esto se realiza para detectar osteoporosis. Se le puede realizar este examen de deteccin si tiene un riesgo alto de tener osteoporosis. Siga estas instrucciones en su casa: Comida y bebida  Siga una dieta que incluya frutas frescas y verduras, cereales integrales, lcteos descremados y protenas magras.  Tome los suplementos vitamnicos y [employer] se lo haya indicado el mdico.  No beba alcohol si: ? Su mdico le indica no hacerlo. ? Est embarazada, puede estar embarazada o est tratando de quedar embarazada.  Si bebe alcohol: ? Limite la cantidad que consume de 0 a 1 medida por da. ? Est atenta a la cantidad de alcohol que hay en las bebidas que toma. En los  [place], una medida equivale a una botella de cerveza de 12oz (331m), un vaso de vino de 5oz (1458m o un vaso de una bebida alcohlica de alta graduacin de 1oz (4469m Estilo de vidNavistar International Corporationlas encas a diario.  Mantngase activa. Haga al menos 4m28mos de ejercicio 5o ms das Hilton Hotelso consuma ningn producto que contenga nicotina o tabaco, como cigarrillos, cigarrillos electrnicos y tabaco de mascHigher education careers adviser necesita ayuda para dejar de fumar, consulte al mdico.  Si es sexualmente activa, practique sexo seguro. Use un condn u otra forma de mtodo anticonceptivo (anticonceptivos) a fin de evitEnvironmental health practitionerTS (infecciones de transmisin sexual).  Si el mdico se lo indic, tome una dosis baja de aspirina diariamente a partir de los 50 a70 de edadOak Hillsndo volver?  Visite al mdico una vez al ao para una visita de control.  Pregntele al mdico con qu frecuencia debe realizarse un control de la vista y los dientes.  Mantenga su esquema de vacunacin al da. Esta informacin no tiene comoMarine scientistconsejo del mdico. Asegrese de hacerle al mdico cualquier pregunta que tenga. Document Revised: 02/06/2018 Document Reviewed: 02/06/2018 Elsevier Patient Education  2020  [place] controlar su hipertensin Managing Your Hypertension La hipertensin se denomina usualmente presin arterial alta. Ocurre cuando la sangre presiona contra las paredes de las arterias con demasiada fuerza. Las arterias son los vasos sanguneos que transportan la sangre desde el corazn hacia todas las partes del cuerpo. La hipertensin hace que el corazn haga ms esfuerzo para Chiropodist y [employer] Corporation que las arterias se Teacher, music o Advertising account executive. La hipertensin no tratada o no controlada puede causar infarto de miocardio, accidente cerebrovascular, enfermedad renal y otros problemas. Qu son las Futures trader de presin arterial? Una lectura de la  presin arterial consiste de un nmero ms alto sobre un nmero ms bajo. En condiciones ideales, la presin arterial debe estar por debajo de 120/80. El primer nmero ("superior") es la presin sistlica. Es la medida de la presin de las arterias cuando el corazn late. El segundo nmero ("inferior") es la presin diastlica. Es la medida de la presin en las arterias cuando el corazn se relaja. Qu significa mi lectura de presin arterial? La presin arterial se clasifica en cuatro etapas. Sobre la base de la lectura de su presin arterial, el mdico puede usar las siguientes etapas para determinar si necesita tratamiento y de qu tipo. La presin sistlica y la presin diastlica se miden en una unidad llamada mm Hg. Normal  Presin sistlica: por debajo de 678.  Presin diastlica: por debajo de 80. Elevada  Presin sistlica: 938-101.  Presin diastlica: por debajo de 80. Etapa 1 de hipertensin  Presin sistlica: 751-025.  Presin diastlica: 85-27. Etapa 2 de hipertensin  Presin sistlica: 782 o ms.  Presin diastlica: 90 o ms. Cules son los riesgos para la salud asociados con la hipertensin? Controlar la hipertensin es una responsabilidad importante. La hipertensin no controlada puede causar:  Infarto de miocardio.  Accidente cerebrovascular.  Debilitamiento de los vasos sanguneos (aneurisma).  Insuficiencia cardaca.  Dao renal.  Dao ocular.  Sndrome metablico.  Problemas de memoria y concentracin. Qu cambios puedo hacer para controlar mi hipertensin? La hipertensin se puede controlar haciendo McDonald[employer] Corporation estilo de vida y, posiblemente, tomando medicamentos. Su mdico le ayudar a crear un plan para bajar la presin arterial al rango normal. Comida y bebida   Siga una dieta con alto contenido de fibras y [place], y con bajo contenido de sal (sodio), azcar agregada y Daphene Jaeger. Un ejemplo de plan alimenticio es la dieta DASH (Dietary  Approaches to Stop Hypertension, Mtodos alimenticios para detener la hipertensin). Para alimentarse de esta manera: ? Coma mucha fruta y [place]. Trate de que la mitad del plato de cada comida sea de frutas y verduras. ? Coma cereales integrales, como pasta integral, arroz integral y pan integral. Llene aproximadamente un cuarto del plato con cereales integrales. ? Consuma productos lcteos con bajo contenido de grasa. ? Evite la ingesta de cortes de carne grasa, carne procesada o curada, y carne de ave con piel. Llene aproximadamente un cuarto del plato con protenas magras, como pescado, pollo sin piel, frijoles, huevos y tofu. ? Evite ingerir alimentos prehechos o procesados. En general, estos tienen mayor cantidad de sodio, azcar agregada y Wendee Copp.  Reduzca su ingesta diaria de sodio. La mayora de las personas que tienen hipertensin deben comer menos de 1500 mg de sodio por [employer].  Limite el consumo de alcohol a no ms de 1 medida por da si es mujer y no est Music therapist y a 2 medidas por da si es hombre. Una medida equivale a 23onzas de  cerveza, 5onzas de vino o 1onzas de bebidas alcohlicas de alta graduacin. Estilo de vida  Trabaje con su mdico para mantener un peso saludable o Administrator, Civil Service. Pregntele cual es su peso recomendado.  Realice al menos 30 minutos de ejercicio que haga que se acelere su corazn (ejercicio Arboriculturist) la [employer] de la [place]. Estas actividades pueden incluir caminar, nadar o andar en bicicleta.  Incluya ejercicios para fortalecer sus msculos (ejercicios de resistencia), como levantamiento de pesas, como parte de su rutina semanal de ejercicios. Intente realizar 31mnutos de este tipo de ejercicios al mSolectron Corporationa la sBrum[place]  No consuma ningn producto que contenga nicotina o tabaco, como cigarrillos y cPsychologist, sport and exercise Si necesita ayuda para dejar de fumar, consulte al mMeadWestvaco  Controle las enfermedades a largo plazo  (crnicas), como el colesterol alto o la diabetes. Control  Contrlese la presin arterial en su casa segn las indicaciones del mdico. La presin arterial deseada puede variar en funcin de las enfermedades, la edad y otros factores personales.  Contrlese la presin arterial de manera regular, en la frecuencia indicada por su mdico. Trabaje con su mdico  Revise con su mdico todos los medicamentos que toma ya que puede haber efectos secundarios o interacciones.  Hable con su mdico acerca de la dieta, hbitos de ejercicio y otros factores del estilo de vida que pueden contribuir a la hipertensin.  Consulte a su mdico regularmente. Su mdico puede ayudarle a crear y aTurks and Caicos Islandssu plan para controlar la hipertensin. Debo tomar un medicamento para controlar mi presin arterial? El mdico puede recetarle medicamentos si los cambios en el estilo de vida no son suficientes para lChild psychotherapistla presin arterial y si:  Su presin arterial sistlica es de 1546o ms.  Su presin arterial diastlica es de 80 o ms. Tome los medicamentos solamente como se lo haya indicado el mdico. Siga cuidadosamente las indicaciones. Los medicamentos para la presin arterial deben tomarse segn las indicaciones. Los medicamentos pierden eficacia al omitir las dosis. El hecho de omitir las dosis tambin aSerbiael riesgo de otros problemas. Comunquese con un mdico si:  Piensa que tiene uNurse, mental healtha los medicamentos que ha tomado.  Tiene dolores de cabeza frecuentes (recurrentes).  Siente mareos.  Tiene hinchazn en los tobillos.  Tiene problemas de visin. Solicite ayuda de inmediato si:  Siente un dolor de cabeza intenso o confusin.  Siente debilidad inusual, adormecimiento o que sGeneticist, molecular  Siente un dolor intenso en el pecho o el abdomen.  Vomita repetidas veces.  Tiene dificultad para respirar. Resumen  La hipertensin se produce cuando la sangre bombea en las arterias  con mucha fuerza. Si esta afeccin no se controla, podra correr riesgo de tener complicaciones graves.  La presin arterial deseada puede variar en funcin de las enfermedades, la edad y otros factores personales. Para la mComcast una presin arterial normal es menor que 120/80.  La hipertensin se puede controlar mediante cambios en el estilo de vida, tomando medicamentos, o ambas cosas. Los cMcDonald's Corporationestilo de vida incluyen prdida de peso, ingerir alimentos sanos, seguir una dieta baja en sodio, hacer ms ejercicio y lEnvironmental consultantconsumo de alcohol. Esta informacin no tiene cMarine scientistel consejo del mdico. Asegrese de hacerle al mdico cualquier pregunta que tenga. Document Revised: 07/02/2016 Document Reviewed: 04/04/2016 Elsevier Patient Education  2ElmdaleArthritis "Artritis" es un trmino que se utiliza comnmente para referirse al dARAMARK Corporationen  las articulaciones o a una enfermedad de las articulaciones. Hay ms de 100 tipos de artritis. Cules son las causas? La causa ms frecuente de esta afeccin es el desgaste de una articulacin. Otras causas son:  [place].  Inflamacin de una articulacin.  Infeccin en una articulacin.  Esguinces y otras lesiones cercanas a la articulacin.  Reaccin a medicamentos o frmacos, o una Risk analyst. En algunos casos, es posible que la causa se desconozca. Cules son los signos o los sntomas? El sntoma principal de esta afeccin es el dolor en la articulacin al hacer movimientos. Otros sntomas pueden incluir los siguientes:  Enrojecimiento, hinchazn o Advertising account executive.  Calor que proviene de Water engineer.  Cristy Hilts.  Sensacin general de enfermedad. Cmo se diagnostica? Esta afeccin puede diagnosticarse mediante un examen fsico y estudios; entre ellos, los siguientes:  Anlisis de [place].  Anlisis de [place].  Estudios de diagnstico por imgenes, como  radiografas, una resonancia magntica (RM) o una exploracin por tomografa computarizada (TC). Algunas veces se extrae lquido de la articulacin para analizarlo. Cmo se trata? El tratamiento de esta afeccin puede incluir:  El tratamiento de la causa si se la conoce.  Reposo.  Levantamiento (elevacin) de la articulacin.  Aplicar compresas fras o calientes a la articulacin.  Medicamentos para mejorar los sntomas y Risk manager.  Inyecciones de corticoesteroides, tal como cortisona, en la articulacin para ayudar a Dietitian y la inflamacin. Segn la causa de la artritis, es posible que deba hacer cambios en su estilo de vida para reducir la sobrecarga en la articulacin. Los cambios pueden incluir lo siguiente:  Hacer ms ejercicio.  Bajar de [place]. Siga estas instrucciones en su casa: Medicamentos  [employer] de venta libre y los recetados solamente como se lo haya indicado el mdico.  No tome aspirina para Theatre stage manager dolor si el mdico cree que la causa del dolor puede ser la gota. Actividad  Mantenga la articulacin en reposo como se lo haya indicado el mdico. El descanso es importante cuando la enfermedad est activa y la articulacin le duele, est hinchada o est rgida.  Evite las actividades que [employer]. Es importante equilibrar [place] y reposo.  Ejercite la articulacin peridicamente con ejercicios de amplitud de movimiento como se lo haya indicado el mdico. Pruebe hacer ejercicio de bajo impacto, por ejemplo: ? Natacin. ? Gimnasia acutica. ? Andar en bicicleta. ? Caminar. Control del dolor, la rigidez y la hinchazn      Si se lo indican, aplique hielo Herbalist. ? Ponga el hielo en una bolsa plstica. ? Coloque una [employer] piel y Therapist, nutritional. ? Coloque el hielo durante 40mnutos, 2a3veces al da.  Si la articulacin est hinchada, levntela (elvela) por encima del nivel del corazn  si se lo indic el mdico.  Si tiene la articulacin rgida por la maana, pruebe tomar una ducha tibia.  Si se lo indican, aplique calor en la zona afectada con la frecuencia que le haya indicado el mdico. Use la fuente de calor que el mdico le recomiende, como una compresa de calor hmedo o una almohadilla trmica. Si tiene diabetes, no aplique calor sin autorizacin del mdico. Para aplicar calor: ? Coloque una toalla entre la piel y la fuente de cFreight forwarder ? Aplique calor durante 20 a 360mutos. ? Retire la fuente de calor si la piel se pone de color rojo brillante. Esto es especialmente importante si no puede sentir dolor, calor o fro. Puede  correr un riesgo mayor de sufrir quemaduras. Indicaciones generales  No consuma ningn producto que contenga nicotina o tabaco, como cigarrillos, cigarrillos electrnicos y tabaco de Higher education careers adviser. Si necesita ayuda para dejar de fumar, consulte al mdico.  Concurra a todas las visitas de seguimiento como se lo haya indicado el mdico. Esto es importante. Comunquese con un mdico si:  [place] representative.  Tiene fiebre. Solicite ayuda inmediatamente si:  Tiene dolor intenso, hinchazn o enrojecimiento en la articulacin.  Comienza a tener hinchazn y Market researcher.  Comienza a sentir un dolor intenso en la espalda.  Tiene la pierna muy dbil.  No puede controlar los intestinos o la vejiga. Resumen  "Artritis" es un trmino que se utiliza comnmente para referirse al ARAMARK Corporation en las articulaciones o a una enfermedad de las articulaciones. Hay ms de 100 tipos de artritis.  La causa ms frecuente de esta afeccin es el desgaste de una articulacin. Otras causas incluyen gota, inflamacin o infeccin en la articulacin, esguinces o alergias.  Los principales sntomas de esta afeccin son enrojecimiento, hinchazn o Advertising account executive. Otros sntomas incluyen calor, fiebre o sentirse enfermo.  Esta afeccin se trata con reposo,  elevacin, medicamentos y aplicacin de compresas fras o calientes.  Siga las indicaciones del mdico acerca de los medicamentos, la [place], los ejercicios y otros tratamientos para el cuidado Financial planner. Esta informacin no tiene Marine scientist el consejo del mdico. Asegrese de hacerle al mdico cualquier pregunta que tenga. Document Revised: 05/12/2018 Document Reviewed: 05/12/2018 Elsevier Patient Education  [place].

## 2020-03-07 NOTE — Progress Notes (Signed)
Subjective:  Patient accompanied by her niece who is interpreting.  A Spanish interpreter was in clinic for the visit, but declined by patient on arrival.   Allison Cook is a 59 y.o. female and is here for a comprehensive physical exam. The patient reports problems -left finger pain, arthritis.  Patient notes ongoing left second digit pain with flexion.  Patient endorses numbness and tingling in left upper extremity when her finger starts hurting.  Patient denies finger being stuck in flexion.  Patient may take 4 Advil for her symptoms.  Patient also notes arthritis in low back and knees.  Patient has not scheduled a mammogram.  Endorses checking breast at home and not noticing any changes.  Last Pap 2 years ago per patient.  Patient interested in influenza vaccine.  Social History   Socioeconomic History   Marital status: Single    Spouse name: Not on file   Number of children: 0   Years of education: Not on file   Highest education level: Not on file  Occupational History   Occupation: hotel housekeeper  Tobacco Use   Smoking status: Never Smoker   Smokeless tobacco: Never Used  Substance and Sexual Activity   Alcohol use: No   Drug use: No   Sexual activity: Not Currently  Other Topics Concern   Not on file  Social History Narrative   Single immigrated from [place] in 1999. Lives at home by self. 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    Social Determinants of Health   Financial Resource Strain:    Difficulty of Paying Living Expenses: Not on file  Food Insecurity:    Worried About Charity fundraiser in the Last Year: Not on file   [employer] of Food in the Last Year: Not on file  Transportation Needs:    Lack of Transportation (Medical): Not on file   Lack of Transportation (Non-Medical): Not on file  Physical Activity:    Days of Exercise per Week: Not on file   Minutes of Exercise per Session: Not on file  Stress:    Feeling of Stress : Not on  file  Social Connections:    Frequency of Communication with Friends and Family: Not on file   Frequency of Social Gatherings with Friends and Family: Not on file   Attends Religious Services: Not on file   Active Member of [employer] or Organizations: Not on file   Attends Archivist Meetings: Not on file   Marital Status: Not on file  Intimate Partner Violence:    Fear of Current or Ex-Partner: Not on file   Emotionally Abused: Not on file   Physically Abused: Not on file   Sexually Abused: Not on file   Health Maintenance  Topic Date Due   COVID-19 Vaccine (1) Never done   MAMMOGRAM  05/21/2018   PAP SMEAR-Modifier  06/05/2019   INFLUENZA VACCINE  12/06/2019   COLONOSCOPY  02/28/2021 (Originally 03/02/2011)   TETANUS/TDAP  03/23/2020   Hepatitis C Screening  Completed   HIV Screening  Completed    The following portions of the patient's history were reviewed and updated as appropriate: allergies, current medications, past family history, past medical history, past social history, past surgical history and problem list.  Review of Systems Pertinent items noted in HPI and remainder of comprehensive ROS otherwise negative.   Objective:    BP 122/78 (BP Location: Left Arm, Patient Position: Sitting, Cuff Size: Normal)    Pulse 62  Temp 97.9 F (36.6 C) (Oral)    Wt 198 lb 3.2 oz (89.9 kg)    LMP 10/16/2008    SpO2 98%    BMI 35.11 kg/m  General appearance: alert, cooperative and no distress Head: Normocephalic, without obvious abnormality, atraumatic Eyes: conjunctivae/corneas clear. PERRL, EOM's intact. Fundi benign. Ears: normal TM's and external ear canals both ears Nose: Nares normal. Septum midline. Mucosa normal. No drainage or sinus tenderness. Throat: lips, mucosa, and tongue normal; teeth and gums normal Neck: no adenopathy, no carotid bruit, no JVD, supple, symmetrical, trachea midline and thyroid not enlarged, symmetric, no  tenderness/mass/nodules Lungs: clear to auscultation bilaterally Heart: regular rate and rhythm, S1, S2 normal, no murmur, click, rub or gallop Abdomen: soft, non-tender; bowel sounds normal; no masses,  no organomegaly Extremities: extremities normal, atraumatic, no cyanosis or edema  L 2nd digit with mild edema at MCP joint, no TTP, pt tries not actively flex finger if able.  Finger with full ROM. Pulses: 2+ and symmetric Skin: Skin color, texture, turgor normal. No rashes or lesions Lymph nodes: Cervical, supraclavicular, and axillary nodes normal. Neurologic: Alert and oriented X 3, normal strength and tone. Normal symmetric reflexes. Normal coordination and gait    Assessment:    Healthy female exam with pain and mild edema of left second digit     Plan:     Anticipatory guidance given including wearing seatbelts, smoke detectors in the home, increasing physical activity, increasing p.o. intake of water and vegetables. -We will obtain labs -Patient given information to schedule mammogram -Pap up-to-date -GERD handout -Next CPE in 1 year See After Visit Summary for Counseling Recommendations    Essential hypertension  -Controlled -Continue lisinopril-hydrochlorothiazide 20-25 mg -Continue lifestyle modifications - Plan: CMP with eGFR([facility]), lisinopril-hydrochlorothiazide (ZESTORETIC) 20-25 MG tablet, CMP with eGFR([facility])  Left hand pain  -Discussed possible causes including arthritis, tendinitis -We will obtain imaging to rule out RA -Discussed supportive care - Plan: DG Hand Complete Left, CBC with Differential/Platelet  Paresthesia  - Plan: DG Hand Complete Left, Lipid panel, Hemoglobin A1c, CBC with Differential/Platelet, Lipid panel, Hemoglobin A1c, CBC with Differential/Platelet  Need for influenza vaccination  - Plan: Flu Vaccine QUAD 6+ mos PF IM (Fluarix Quad PF)  Follow-up in 1 month, sooner if needed  Grier Mitts, MD

## 2020-03-09 LAB — CBC WITH DIFFERENTIAL/PLATELET
Absolute Monocytes: 403 cells/uL (ref 200–950)
Basophils Absolute: 37 cells/uL (ref 0–200)
Basophils Relative: 0.6 %
Eosinophils Absolute: 198 cells/uL (ref 15–500)
Eosinophils Relative: 3.2 %
HCT: 42.4 % (ref 35.0–45.0)
Hemoglobin: 13.8 g/dL (ref 11.7–15.5)
Lymphs Abs: 1848 cells/uL (ref 850–3900)
MCH: 28.3 pg (ref 27.0–33.0)
MCHC: 32.5 g/dL (ref 32.0–36.0)
MCV: 87.1 fL (ref 80.0–100.0)
MPV: 11.6 fL (ref 7.5–12.5)
Monocytes Relative: 6.5 %
Neutro Abs: 3714 cells/uL (ref 1500–7800)
Neutrophils Relative %: 59.9 %
Platelets: 224 10*3/uL (ref 140–400)
RBC: 4.87 10*6/uL (ref 3.80–5.10)
RDW: 12.5 % (ref 11.0–15.0)
Total Lymphocyte: 29.8 %
WBC: 6.2 10*3/uL (ref 3.8–10.8)

## 2020-03-09 LAB — HEMOGLOBIN A1C
Hgb A1c MFr Bld: 5.8 % of total Hgb — ABNORMAL HIGH (ref <5.7)
Mean Plasma Glucose: 120 (calc)
eAG (mmol/L): 6.6 (calc)

## 2020-03-09 LAB — COMPLETE METABOLIC PANEL WITH GFR
AG Ratio: 1.6 (calc) (ref 1.0–2.5)
ALT: 17 U/L (ref 6–29)
AST: 15 U/L (ref 10–35)
Albumin: 4.3 g/dL (ref 3.6–5.1)
Alkaline phosphatase (APISO): 73 U/L (ref 37–153)
BUN: 20 mg/dL (ref 7–25)
CO2: 31 mmol/L (ref 20–32)
Calcium: 10.1 mg/dL (ref 8.6–10.4)
Chloride: 103 mmol/L (ref 98–110)
Creat: 0.78 mg/dL (ref 0.50–1.05)
GFR, Est African American: 96 mL/min/{1.73_m2} (ref >=60)
GFR, Est Non African American: 83 mL/min/{1.73_m2} (ref >=60)
Globulin: 2.7 g/dL (calc) (ref 1.9–3.7)
Glucose, Bld: 96 mg/dL (ref 65–99)
Potassium: 4.6 mmol/L (ref 3.5–5.3)
Sodium: 140 mmol/L (ref 135–146)
Total Bilirubin: 0.5 mg/dL (ref 0.2–1.2)
Total Protein: 7 g/dL (ref 6.1–8.1)

## 2020-03-09 LAB — TEST AUTHORIZATION

## 2020-03-09 LAB — LIPID PANEL
Cholesterol: 160 mg/dL (ref <200)
HDL: 59 mg/dL (ref >=50)
LDL Cholesterol (Calc): 80 mg/dL (calc)
Non-HDL Cholesterol (Calc): 101 mg/dL (calc) (ref <130)
Total CHOL/HDL Ratio: 2.7 (calc) (ref <5.0)
Triglycerides: 114 mg/dL (ref <150)

## 2020-03-09 LAB — FOLATE: Folate: 12.2 ng/mL

## 2020-03-09 LAB — VITAMIN B12: Vitamin B-12: 556 pg/mL (ref 200–1100)

## 2021-03-24 ENCOUNTER — Encounter: Payer: Self-pay | Admitting: Family Medicine

## 2021-03-29 ENCOUNTER — Other Ambulatory Visit: Payer: Self-pay | Admitting: Family Medicine

## 2021-03-29 ENCOUNTER — Encounter: Payer: Self-pay | Admitting: Family Medicine

## 2021-03-29 ENCOUNTER — Ambulatory Visit (INDEPENDENT_AMBULATORY_CARE_PROVIDER_SITE_OTHER): Payer: Self-pay | Admitting: Family Medicine

## 2021-03-29 VITALS — BP 124/76 | HR 74 | Temp 97.9°F | Wt 200.6 lb

## 2021-03-29 DIAGNOSIS — I10 Essential (primary) hypertension: Secondary | ICD-10-CM

## 2021-03-29 DIAGNOSIS — E559 Vitamin D deficiency, unspecified: Secondary | ICD-10-CM

## 2021-03-29 DIAGNOSIS — Z0000 Encounter for general adult medical examination without abnormal findings: Secondary | ICD-10-CM

## 2021-03-29 DIAGNOSIS — Z13220 Encounter for screening for lipoid disorders: Secondary | ICD-10-CM

## 2021-03-29 DIAGNOSIS — Z1211 Encounter for screening for malignant neoplasm of colon: Secondary | ICD-10-CM

## 2021-03-29 DIAGNOSIS — R413 Other amnesia: Secondary | ICD-10-CM

## 2021-03-29 DIAGNOSIS — Z23 Encounter for immunization: Secondary | ICD-10-CM

## 2021-03-29 LAB — TSH: TSH: 1.74 u[IU]/mL (ref 0.35–5.50)

## 2021-03-29 LAB — T4, FREE: Free T4: 0.92 ng/dL (ref 0.60–1.60)

## 2021-03-29 LAB — VITAMIN D 25 HYDROXY (VIT D DEFICIENCY, FRACTURES): VITD: 18.88 ng/mL — ABNORMAL LOW (ref 30.00–100.00)

## 2021-03-29 LAB — CBC WITH DIFFERENTIAL/PLATELET
Basophils Absolute: 0.1 10*3/uL (ref 0.0–0.1)
Basophils Relative: 0.6 % (ref 0.0–3.0)
Eosinophils Absolute: 0.2 10*3/uL (ref 0.0–0.7)
Eosinophils Relative: 2.8 % (ref 0.0–5.0)
HCT: 40.4 % (ref 36.0–46.0)
Hemoglobin: 13.2 g/dL (ref 12.0–15.0)
Lymphocytes Relative: 23.7 % (ref 12.0–46.0)
Lymphs Abs: 2.1 10*3/uL (ref 0.7–4.0)
MCHC: 32.8 g/dL (ref 30.0–36.0)
MCV: 85.9 fl (ref 78.0–100.0)
Monocytes Absolute: 0.6 10*3/uL (ref 0.1–1.0)
Monocytes Relative: 6.3 % (ref 3.0–12.0)
Neutro Abs: 6 10*3/uL (ref 1.4–7.7)
Neutrophils Relative %: 66.6 % (ref 43.0–77.0)
Platelets: 219 10*3/uL (ref 150.0–400.0)
RBC: 4.7 Mil/uL (ref 3.87–5.11)
RDW: 13.4 % (ref 11.5–15.5)
WBC: 9 10*3/uL (ref 4.0–10.5)

## 2021-03-29 LAB — LIPID PANEL
Cholesterol: 153 mg/dL (ref 0–200)
HDL: 55.2 mg/dL (ref >39.00)
LDL Cholesterol: 62 mg/dL (ref 0–99)
NonHDL: 98.24
Total CHOL/HDL Ratio: 3
Triglycerides: 180 mg/dL — ABNORMAL HIGH (ref 0.0–149.0)
VLDL: 36 mg/dL (ref 0.0–40.0)

## 2021-03-29 LAB — COMPREHENSIVE METABOLIC PANEL
ALT: 19 U/L (ref 0–35)
AST: 19 U/L (ref 0–37)
Albumin: 4.3 g/dL (ref 3.5–5.2)
Alkaline Phosphatase: 82 U/L (ref 39–117)
BUN: 20 mg/dL (ref 6–23)
CO2: 32 mEq/L (ref 19–32)
Calcium: 10.1 mg/dL (ref 8.4–10.5)
Chloride: 101 mEq/L (ref 96–112)
Creatinine, Ser: 1 mg/dL (ref 0.40–1.20)
GFR: 61.42 mL/min (ref >60.00)
Glucose, Bld: 102 mg/dL — ABNORMAL HIGH (ref 70–99)
Potassium: 3.7 mEq/L (ref 3.5–5.1)
Sodium: 140 mEq/L (ref 135–145)
Total Bilirubin: 0.3 mg/dL (ref 0.2–1.2)
Total Protein: 7.3 g/dL (ref 6.0–8.3)

## 2021-03-29 LAB — HEMOGLOBIN A1C: Hgb A1c MFr Bld: 6.1 % (ref 4.6–6.5)

## 2021-03-29 LAB — VITAMIN B12: Vitamin B-12: 399 pg/mL (ref 211–911)

## 2021-03-29 MED ORDER — LISINOPRIL-HYDROCHLOROTHIAZIDE 20-25 MG PO TABS: 1.0000 | ORAL_TABLET | Freq: Every day | ORAL | 3 refills | Status: DC

## 2021-03-29 MED ORDER — VITAMIN D (ERGOCALCIFEROL) 1.25 MG (50000 UNIT) PO CAPS
50000.0000 [IU] | ORAL_CAPSULE | ORAL | 0 refills | Status: DC
Start: 2021-03-29 — End: 2021-11-17

## 2021-03-29 NOTE — Progress Notes (Signed)
Subjective:   Pt accompanied by her niece who is interpreting.  Declined Spanish interpreter.   Allison Cook is a 60 y.o. female and is here for a comprehensive physical exam. The patient reports memory concerns.  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  Pt states co-workers are noticing and starting to use it against her at work.  Pt keeps to herself and does not really talk with the ppl at work.  Pt's niece states pt will keep asking the same question like what day is it.  Memory has been an ongoing concern x 1 yr, but has become worse in the last few months.  Pt did not complete HS.  Thinks she completed 9th or 10th grade.  Pt denies constipation, diarrhea, palpitations, changes in hair or skin.  Patient has not had a recent mammogram.  Taking lisinopril-hydrochlorothiazide for blood pressure.  Social History   Socioeconomic History   Marital status: Single    Spouse name: Not on file   Number of children: 0   Years of education: Not on file   Highest education level: Not on file  Occupational History   Occupation: hotel housekeeper  Tobacco Use   Smoking status: Never   Smokeless tobacco: Never  Substance and Sexual Activity   Alcohol use: No   Drug use: No   Sexual activity: Not Currently  Other Topics Concern   Not on file  Social History Narrative   Single immigrated from [place] in 1999. Lives at home by self. 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    Social Determinants of Health   Financial Resource Strain: Not on file  Food Insecurity: Not on file  Transportation Needs: Not on file  Physical Activity: Not on file  Stress: Not on file  Social Connections: Not on file  Intimate Partner Violence: Not on file   Health Maintenance  Topic Date Due   COVID-19 Vaccine (1) Never done   COLONOSCOPY (Pts 45-32yrs Insurance coverage will need to be confirmed)  Never done   Zoster Vaccines- Shingrix (1 of 2) Never done    MAMMOGRAM  05/21/2018   PAP SMEAR-Modifier  06/05/2019   TETANUS/TDAP  03/23/2020   INFLUENZA VACCINE  12/05/2020   Hepatitis C Screening  Completed   HIV Screening  Completed   Pneumococcal Vaccine 84-52 Years old  Aged Out   HPV VACCINES  Aged Out    The following portions of the patient's history were reviewed and updated as appropriate: allergies, current medications, past family history, past medical history, past social history, past surgical history, and problem list.  Review of Systems Pertinent items noted in HPI and remainder of comprehensive ROS otherwise negative.   Objective:    LMP 10/16/2008  General appearance: alert, cooperative, and no distress Head: Normocephalic, without obvious abnormality, atraumatic Eyes: conjunctivae/corneas clear. PERRL, EOM's intact. Fundi benign. Ears: normal TM's and external ear canals both ears Nose: Nares normal. Septum midline. Mucosa normal. No drainage or sinus tenderness. Throat: lips, mucosa, and tongue normal; teeth and gums normal Neck: no adenopathy, no carotid bruit, no JVD, supple, symmetrical, trachea midline, and thyroid not enlarged, symmetric, no tenderness/mass/nodules Lungs: clear to auscultation bilaterally Heart: regular rate and rhythm, S1, S2 normal, no murmur, click, rub or gallop Abdomen: soft, non-tender; bowel sounds normal; no masses,  no organomegaly Extremities: extremities normal, atraumatic, no cyanosis or edema Pulses: 2+ and symmetric Skin: Skin color, texture, turgor normal. No rashes  or lesions or central thinning of hair, dry skin. Lymph nodes: Cervical, supraclavicular, and axillary nodes normal. Neurologic: Alert and oriented X 3, normal strength and tone. Normal symmetric reflexes. Normal coordination and gait    Assessment:    Healthy female exam with memory changes.     Plan:    Anticipatory guidance given including wearing seatbelts, smoke detectors in the home, increasing physical  activity, increasing p.o. intake of water and vegetables. -Obtain labs -Discussed immunizations -Colonoscopy and mammogram due.  Encouraged to schedule mammogram. -Given handouts -Neck CPE in 1 year See After Visit Summary for Counseling Recommendations   Essential hypertension  -controlled -Continue lifestyle modifications -Continue current medications including lisinopril-hydrochlorothiazide 20-25 mg - Plan: CMP, lisinopril-hydrochlorothiazide (ZESTORETIC) 20-25 MG tablet  Memory change  -MOCA testing done this visit.  Score 23/30 with an extra point given for education. -Discussed obtaining labs to assess for reversible causes -Discussed neuropsych testing and imaging based on lab results. - Plan: CBC with Differential/Platelet, TSH, T4, Free, Hemoglobin A1c, CMP, Vitamin B12, Vitamin D, 25-hydroxy  Screening for cholesterol level - Plan: Lipid panel  Need for influenza vaccination -Given this visit  Colon cancer screening -Referral to GI  F/u in 1-2 months, sooner if needed  Abbe Amsterdam, MD

## 2021-03-29 NOTE — Patient Instructions (Signed)
  Depending on lab results we can place and order for neuropsych testing (the additional memory testing) as discussed during today's visit.

## 2021-11-17 ENCOUNTER — Ambulatory Visit (INDEPENDENT_AMBULATORY_CARE_PROVIDER_SITE_OTHER): Payer: Self-pay | Admitting: Family Medicine

## 2021-11-17 ENCOUNTER — Other Ambulatory Visit: Payer: Self-pay | Admitting: Family Medicine

## 2021-11-17 VITALS — BP 130/90 | HR 58 | Temp 97.3°F | Wt 182.4 lb

## 2021-11-17 DIAGNOSIS — M791 Myalgia, unspecified site: Secondary | ICD-10-CM

## 2021-11-17 DIAGNOSIS — R221 Localized swelling, mass and lump, neck: Secondary | ICD-10-CM

## 2021-11-17 DIAGNOSIS — R413 Other amnesia: Secondary | ICD-10-CM

## 2021-11-17 DIAGNOSIS — E559 Vitamin D deficiency, unspecified: Secondary | ICD-10-CM

## 2021-11-17 LAB — BASIC METABOLIC PANEL
BUN: 16 mg/dL (ref 6–23)
CO2: 28 mEq/L (ref 19–32)
Calcium: 10.3 mg/dL (ref 8.4–10.5)
Chloride: 102 mEq/L (ref 96–112)
Creatinine, Ser: 0.78 mg/dL (ref 0.40–1.20)
GFR: 82.38 mL/min (ref >60.00)
Glucose, Bld: 97 mg/dL (ref 70–99)
Potassium: 4.5 mEq/L (ref 3.5–5.1)
Sodium: 139 mEq/L (ref 135–145)

## 2021-11-17 LAB — CBC WITH DIFFERENTIAL/PLATELET
Basophils Absolute: 0.1 10*3/uL (ref 0.0–0.1)
Basophils Relative: 0.7 % (ref 0.0–3.0)
Eosinophils Absolute: 0.1 10*3/uL (ref 0.0–0.7)
Eosinophils Relative: 1.1 % (ref 0.0–5.0)
HCT: 38.4 % (ref 36.0–46.0)
Hemoglobin: 12.7 g/dL (ref 12.0–15.0)
Lymphocytes Relative: 23.1 % (ref 12.0–46.0)
Lymphs Abs: 1.7 10*3/uL (ref 0.7–4.0)
MCHC: 33.1 g/dL (ref 30.0–36.0)
MCV: 86.5 fl (ref 78.0–100.0)
Monocytes Absolute: 0.5 10*3/uL (ref 0.1–1.0)
Monocytes Relative: 6.8 % (ref 3.0–12.0)
Neutro Abs: 5 10*3/uL (ref 1.4–7.7)
Neutrophils Relative %: 68.3 % (ref 43.0–77.0)
Platelets: 236 10*3/uL (ref 150.0–400.0)
RBC: 4.44 Mil/uL (ref 3.87–5.11)
RDW: 13.3 % (ref 11.5–15.5)
WBC: 7.4 10*3/uL (ref 4.0–10.5)

## 2021-11-17 LAB — TSH: TSH: 2.03 u[IU]/mL (ref 0.35–5.50)

## 2021-11-17 LAB — VITAMIN D 25 HYDROXY (VIT D DEFICIENCY, FRACTURES): VITD: 24.37 ng/mL — ABNORMAL LOW (ref 30.00–100.00)

## 2021-11-17 LAB — T4, FREE: Free T4: 1.14 ng/dL (ref 0.60–1.60)

## 2021-11-17 LAB — VITAMIN B12: Vitamin B-12: 1085 pg/mL — ABNORMAL HIGH (ref 211–911)

## 2021-11-17 MED ORDER — CYCLOBENZAPRINE HCL 5 MG PO TABS: 5.0000 mg | ORAL_TABLET | Freq: Every evening | ORAL | 0 refills | Status: DC | PRN

## 2021-11-17 MED ORDER — VITAMIN D (ERGOCALCIFEROL) 1.25 MG (50000 UNIT) PO CAPS: 50000.0000 [IU] | ORAL_CAPSULE | ORAL | 0 refills | Status: DC

## 2021-11-17 NOTE — Patient Instructions (Signed)
Puede usar calor, masajes, estiramientos, medicamentos tpicos como aspercreme, icy hot, biofreeze en el cuello. Se envi a su farmacia una receta para un relajante muscular llamado Flexeril. Este medicamento puede causar somnolencia, as que intente tomarlo por la noche para ver cmo se siente. Puedes probar media pestaa. Se colocaron las referencias para las pruebas de neuropsicologa, Youth worker y para el TAC de su cabeza. Management consultant acerca de Geophysicist/field seismologist.  Avseme si nota que la hinchazn de su cuello/clavcula aumenta o contina.

## 2021-11-17 NOTE — Progress Notes (Signed)
Subjective:    Patient ID: Allison Cook, female    DOB: Mar 24, 1961, 61 y.o.   MRN: [id number]  Chief Complaint  Patient presents with   Memory Loss  Pt accompanied by her niece, Clyda Greener [phone].  Spanish interpreter present during visit.  HPI Patient was seen today for f/u on memory concerns.  Pt notes continued memory changes.  Pt works as a Advertising copywriter at [employer] and is concerned she will forget to do something at work.  Pt's niece notes pt asking the same questions over and over, seems confused at times, and starting to forget things like the month and day.  At [facility] 03/29/21 MoCA score 23/30 with extra point given for education.  Vitamin D was low at 18.88 and Vitamin b12 was low normal at 399.  Thyroid labs were normal.  Pt's mother developed memory issues at age 75.  Pt thinks her paternal aunt may have had memory issues.  Pt mentions pain in neck when turning head to the side.  Occurring times a while.  Endorses a tight sensation.  Tried heat and rubbing area.  Past Medical History:  Diagnosis Date   Allergic rhinitis    Anemia, iron deficiency    This has resolved   Arthritis    Generalized, rx with nsaids.   GERD (gastroesophageal reflux disease)    Hypertension    Irregular menstrual bleeding    Otitis externa    Systolic murmur 8/08   There is vigorous LV function. With this, there is slight increase in the gradient across the pulmonic valve.   Varicose veins     Allergies  Allergen Reactions   Tramadol Nausea And Vomiting    ROS General: Denies fever, chills, night sweats, changes in weight, changes in appetite  +change in memory HEENT: Denies headaches, ear pain, changes in vision, rhinorrhea, sore throat CV: Denies CP, palpitations, SOB, orthopnea Pulm: Denies SOB, cough, wheezing GI: Denies abdominal pain, nausea, vomiting, diarrhea, constipation GU: Denies dysuria, hematuria, frequency, vaginal discharge Msk: Denies muscle  cramps, joint pains  +R sided neck pain Neuro: Denies weakness, numbness, tingling Skin: Denies rashes, bruising Psych: Denies depression, anxiety, hallucinations     Objective:    Blood pressure 130/90, pulse (!) 58, temperature (!) 97.3 F (36.3 C), temperature source Oral, weight 182 lb 6.4 oz (82.7 kg), last menstrual period 10/16/2008, SpO2 98 %.  Gen. Pleasant, well-nourished, in no distress, normal affect   HEENT: [place]/AT, face symmetric, conjunctiva clear, no scleral icterus, PERRLA, EOMI, nares patent without drainage Lungs: no accessory muscle use, CTAB, no wheezes or rales Cardiovascular: RRR, no m/r/g, no peripheral edema Musculoskeletal: A visible fullness/edema noted to the right of base of anterior neck superior to R medical clavicle, soft, no mobile mass appreciated. Increased tension in R cervical trapezius muscle and R sternocleidomastoid muscle.  No cyanosis or clubbing, normal tone Neuro:  A&Ox3, CN II-XII intact, normal gait Skin:  Warm, no lesions/ rash  Wt Readings from Last 3 Encounters:  11/17/21 182 lb 6.4 oz (82.7 kg)  03/29/21 200 lb 9.6 oz (91 kg)  03/07/20 198 lb 3.2 oz (89.9 kg)    Lab Results  Component Value Date   WBC 7.4 11/17/2021   HGB 12.7 11/17/2021   HCT 38.4 11/17/2021   PLT 236.0 11/17/2021   GLUCOSE 97 11/17/2021   CHOL 153 03/29/2021   TRIG 180.0 (H) 03/29/2021   HDL 55.20 03/29/2021   LDLCALC 62 03/29/2021   ALT 19 03/29/2021   AST  19 03/29/2021   NA 139 11/17/2021   K 4.5 11/17/2021   CL 102 11/17/2021   CREATININE 0.78 11/17/2021   BUN 16 11/17/2021   CO2 28 11/17/2021   TSH 2.03 11/17/2021   HGBA1C 6.1 03/29/2021    Assessment/Plan:  Memory change  -given continued changes in memory discussed proceeding with neuropsych testing and imaging of head. -will repeat labs.  Vitamin D was low in Nov 2022 and Vit B12 was low normal.   -MoCA  score 23/30 on 03/29/21 -CT head -Referrals placed to neurology and for neuropsych  testing.  Contact patient's niece Al Decant [phone], in regards to scheduling referrals. - Plan: Ambulatory referral to Neurology, Neuropsychological testing, CT HEAD WO CONTRAST ( ), Vitamin D, 25-hydroxy, Vitamin B12, TSH, T4, Free, Basic metabolic panel, CBC with Differential/Platelet  Localized swelling, mass or lump of neck -Discussed possible causes including lipoma versus edema due to pulmonary etiology. -Discussed obtaining imaging such as ultrasound or CT. patient wishes to wait on imaging until memory concerns are evaluated.  -Continue to monitor.  Muscle tension pain  -Discussed various causes including stress, overuse -Discussed supportive care including heat, massage, stretching, topical analgesics, Tylenol or NSAIDs as needed -Will try muscle relaxer nightly as needed as may cause drowsiness.  Patient can try half a tab (2.5 mg) to see if causes residual grogginess in AM. -For continued symptoms consider PT - Plan: cyclobenzaprine (FLEXERIL) 5 MG tablet  F/u in 1 month, sooner if needed.  Abbe Amsterdam, MD

## 2021-11-22 NOTE — Progress Notes (Signed)
Contacted [place], niece (DPR). She was made of patient's lab result. Verbalized understand. She updated that she has not receive any call in regards to CT scan. Team Raphael Gibney regards to this. Raphael Gibney states she will follow up with this.

## 2021-11-27 ENCOUNTER — Encounter: Payer: Self-pay | Admitting: Family Medicine

## 2022-02-01 ENCOUNTER — Ambulatory Visit: Payer: Self-pay | Admitting: Psychiatry

## 2022-02-21 NOTE — Progress Notes (Addendum)
[facility]  PATIENT: Allison Cook DOB: [birth year]  REFERRING CLINICIAN: Deeann Saint, MD HISTORY FROM: self, niece Al Decant REASON FOR VISIT: memory loss   HISTORICAL  CHIEF COMPLAINT:  Chief Complaint  Patient presents with   Memory Loss    Rm 1 with niece Al Decant Pt is well, niece reports memory concerns for two yrs, has worsen in last yr.     HISTORY OF PRESENT ILLNESS:  The patient presents for evaluation of memory loss which began 2 years ago and has worsened over the past year.  She works as a Advertising copywriter at [employer] and has been forgetting to do things at work. Remembers things in her normal routine, but will forget to do things out of her routine. Misplaces objects around the house. Repeats questions multiple times and forgets conversations she had previously that day. She will often forget about appointments and dates. Long term memory is intact.  MCA 03/2021 was 23/30. B12 was 399.   TBI:  No past history of TBI Stroke:  no past history of stroke Seizures:  no past history of seizures Sleep: Has trouble falling asleep due to her thoughts racing. She denies daytime drowsiness Mood: She often feels anxious  Functional status: Patient lives alone Cooking: Will misplace things in the kitchen, no issues with remembering recipes Cleaning: no issues Shopping: no issues Driving: Rides the bus Bills: Daughter pays her bills, but patient will remind her when they are do Medications: Will take her medications but sometimes forgets if she took a dose or not. Daughter helps manage medications Ever left the stove on by accident?: no Forget how to use items around the house?: no Getting lost going to familiar places?: no Forgetting loved ones names?: no Word finding difficulty? Will forget the names of certain items, sometimes stumbles on her words  OTHER MEDICAL CONDITIONS: HTN   REVIEW OF SYSTEMS: Full 14 system review of systems  performed and negative with exception of: memory loss  ALLERGIES: Allergies  Allergen Reactions   Tramadol Nausea And Vomiting    HOME MEDICATIONS: Outpatient Medications Prior to Visit  Medication Sig Dispense Refill   acetaminophen (TYLENOL) 650 MG suppository Place 650 mg rectally every 8 (eight) hours as needed for fever.     ASPIRIN PO Take by mouth.     cyclobenzaprine (FLEXERIL) 5 MG tablet Take 1 tablet (5 mg total) by mouth at bedtime as needed for muscle spasms. 30 tablet 0   ibuprofen (ADVIL,MOTRIN) 200 MG tablet Take 200 mg by mouth every 6 (six) hours as needed.     lisinopril-hydrochlorothiazide (ZESTORETIC) 20-25 MG tablet Take 1 tablet by mouth daily. 90 tablet 3   VITAMIN D PO Take by mouth.     Vitamin D, Ergocalciferol, (DRISDOL) 1.25 MG (50000 UNIT) CAPS capsule Take 1 capsule (50,000 Units total) by mouth every 7 (seven) days. 12 capsule 0   No facility-administered medications prior to visit.    PAST MEDICAL HISTORY: Past Medical History:  Diagnosis Date   Allergic rhinitis    Anemia, iron deficiency    This has resolved   Arthritis    Generalized, rx with nsaids.   GERD (gastroesophageal reflux disease)    Hypertension    Irregular menstrual bleeding    Otitis externa    Systolic murmur 8/08   There is vigorous LV function. With this, there is slight increase in the gradient across the pulmonic valve.   Varicose veins     PAST SURGICAL HISTORY: Past  Surgical History:  Procedure Laterality Date   none      FAMILY HISTORY: Family History  Problem Relation Age of Onset   Hypertension Mother     SOCIAL HISTORY: Social History   Socioeconomic History   Marital status: Single    Spouse name: Not on file   Number of children: 0   Years of education: Not on file   Highest education level: Not on file  Occupational History   Occupation: hotel housekeeper  Tobacco Use   Smoking status: Never   Smokeless tobacco: Never  Substance and Sexual  Activity   Alcohol use: No   Drug use: No   Sexual activity: Not Currently  Other Topics Concern   Not on file  Social History Narrative   Single immigrated from [place] in 1999. Lives at home by self. 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    Social Determinants of Health   Financial Resource Strain: Not on file  Food Insecurity: Not on file  Transportation Needs: Not on file  Physical Activity: Not on file  Stress: Not on file  Social Connections: Not on file  Intimate Partner Violence: Not on file     PHYSICAL EXAM  GENERAL EXAM/CONSTITUTIONAL: Vitals:  Vitals:   02/22/22 0921  BP: (!) 109/54  Pulse: 69  Weight: 176 lb (79.8 kg)  Height: 5\' 2"  (1.575 m)   Body mass index is 32.19 kg/m. Wt Readings from Last 3 Encounters:  02/22/22 176 lb (79.8 kg)  11/17/21 182 lb 6.4 oz (82.7 kg)  03/29/21 200 lb 9.6 oz (91 kg)    NEUROLOGIC: MENTAL STATUS:      No data to display          CRANIAL NERVE:  2nd, 3rd, 4th, 6th - pupils equal and reactive to light, visual fields full to confrontation, extraocular muscles intact, no nystagmus 5th - facial sensation symmetric 7th - facial strength symmetric 8th - hearing intact 9th - palate elevates symmetrically, uvula midline 11th - shoulder shrug symmetric 12th - tongue protrusion midline  MOTOR:  normal bulk and tone, no cogwheeling, full strength in the BUE, BLE  SENSORY:  normal and symmetric to light touch all 4 extremities  COORDINATION:  finger-nose-finger, fine finger movements normal, no tremor  REFLEXES:  deep tendon reflexes present and symmetric  GAIT/STATION:  normal     DIAGNOSTIC DATA (LABS, IMAGING, TESTING) - I reviewed patient records, labs, notes, testing and imaging myself where available.  Lab Results  Component Value Date   WBC 7.4 11/17/2021   HGB 12.7 11/17/2021   HCT 38.4 11/17/2021   MCV 86.5 11/17/2021   PLT 236.0 11/17/2021      Component Value Date/Time   NA 139  11/17/2021 1406   K 4.5 11/17/2021 1406   CL 102 11/17/2021 1406   CO2 28 11/17/2021 1406   GLUCOSE 97 11/17/2021 1406   BUN 16 11/17/2021 1406   CREATININE 0.78 11/17/2021 1406   CREATININE 0.78 03/07/2020 1044   CALCIUM 10.3 11/17/2021 1406   PROT 7.3 03/29/2021 1414   ALBUMIN 4.3 03/29/2021 1414   AST 19 03/29/2021 1414   ALT 19 03/29/2021 1414   ALKPHOS 82 03/29/2021 1414   BILITOT 0.3 03/29/2021 1414   GFRNONAA 83 03/07/2020 1044   GFRAA 96 03/07/2020 1044   Lab Results  Component Value Date   CHOL 153 03/29/2021   HDL 55.20 03/29/2021   LDLCALC 62 03/29/2021   TRIG 180.0 (H) 03/29/2021   CHOLHDL 3  03/29/2021   Lab Results  Component Value Date   HGBA1C 6.1 03/29/2021   Lab Results  Component Value Date   VITAMINB12 1,085 (H) 11/17/2021   Lab Results  Component Value Date   TSH 2.03 11/17/2021     ASSESSMENT AND PLAN  61 y.o. year old female with a history of HTN who presents for evaluation of memory loss over the past 2 years. She is able to perform most of her ADLs independently, but does require some assistance from her daughter with managing her medications. MOCA score today is 10/30, though this may be somewhat confounded by her language barrier (interpreter not available so daughter helped interpret). Will order brain MRI to assess for structural causes of memory loss and signs of neurodegeneration. Referral for neuropsychological testing placed.   1. Memory loss       PLAN: - MRI brain  - Referral for neuropsychological testing    Orders Placed This Encounter  Procedures   MR BRAIN W WO CONTRAST   Ambulatory referral to Neuropsychology    Return in about 6 months (around 08/24/2022).  I spent an average of 42 minutes chart reviewing and counseling the patient, with at least 50% of the time face to face with the patient. General brain health measures discussed, including the importance of regular aerobic exercise.   Ocie Doyne,  MD 02/22/22 10:14 AM  [facility] [address] [place], [place] [address] [phone]

## 2022-02-22 ENCOUNTER — Telehealth: Payer: Self-pay | Admitting: Psychiatry

## 2022-02-22 ENCOUNTER — Ambulatory Visit (INDEPENDENT_AMBULATORY_CARE_PROVIDER_SITE_OTHER): Payer: Self-pay | Admitting: Psychiatry

## 2022-02-22 ENCOUNTER — Encounter: Payer: Self-pay | Admitting: Psychiatry

## 2022-02-22 VITALS — BP 109/54 | HR 69 | Ht 62.0 in | Wt 176.0 lb

## 2022-02-22 DIAGNOSIS — R413 Other amnesia: Secondary | ICD-10-CM

## 2022-02-22 NOTE — Patient Instructions (Signed)
Plan: MRI of the brain Referral for neuropsychological testing   Tasks to improve attention/working memory 1. Good sleep hygiene (7-8 hrs of sleep) 2. Learning a new skill (Painting, Carpentry, Pottery, new language, Knitting). 3.Cognitive exercises (keep a daily journal, Puzzles) 4. Physical exercise and training  (30 min/day X 4 days week) 5. Being on Antidepressant if needed 6.Yoga, Meditation, Tai Chi 7. Decrease alcohol intake 8.Have a clear schedule and structure in daily routine  MIND Diet: The [place] Diet Intervention for Neurodegenerative Delay, or MIND diet, targets the health of the aging brain. Research participants with the highest MIND diet scores had a significantly slower rate of cognitive decline compared with those with the lowest scores. The effects of the MIND diet on cognition showed greater effects than either the Mediterranean or the DASH diet alone.  The healthy items the MIND diet guidelines suggest include:  3+ servings a day of whole grains 1+ servings a day of vegetables (other than green leafy) 6+ servings a week of green leafy vegetables 5+ servings a week of nuts 4+ meals a week of beans 2+ servings a week of berries 2+ meals a week of poultry 1+ meals a week of fish Mainly olive oil if added fat is used  The unhealthy items to avoid, which are higher in saturated and trans fat, include: Less than 5 servings a week of pastries and sweets Less than 4 servings a week of red meat (including beef, pork, lamb, and products made from these meats) Less than one serving a week of cheese and fried foods Less than 1 tablespoon a day of butter/stick margarine

## 2022-02-22 NOTE — Telephone Encounter (Signed)
Referral sent to [facility], phone # [phone]. 

## 2022-02-22 NOTE — Telephone Encounter (Signed)
self-pay sent to GI [phone] 

## 2022-03-08 ENCOUNTER — Encounter: Payer: Self-pay | Admitting: Psychiatry

## 2022-03-08 ENCOUNTER — Telehealth: Payer: Self-pay | Admitting: Psychiatry

## 2022-03-08 NOTE — Telephone Encounter (Signed)
Sent letter to pt to inform of appointment change from 09/27/22 to 08/22/22 - MD out

## 2022-03-16 NOTE — Telephone Encounter (Signed)
[facility] sent a fax that they have tried reaching the pt multiple times to schedule and have not heard back. Please tell her to call them at [phone] if she asks about referral.

## 2022-04-04 ENCOUNTER — Ambulatory Visit
Admission: RE | Admit: 2022-04-04 | Discharge: 2022-04-04 | Disposition: A | Discharge status: Home / Self Care | Payer: Self-pay | Source: Ambulatory Visit | Attending: Psychiatry | Admitting: Psychiatry

## 2022-04-04 DIAGNOSIS — R413 Other amnesia: Secondary | ICD-10-CM

## 2022-04-04 MED ORDER — GADOPICLENOL 0.5 MMOL/ML IV SOLN
8.0000 mL | Freq: Once | INTRAVENOUS | Status: AC | PRN
  Administered 2022-04-04: 8 mL via INTRAVENOUS

## 2022-04-11 ENCOUNTER — Telehealth: Payer: Self-pay | Admitting: Neurology

## 2022-04-11 NOTE — Telephone Encounter (Signed)
Called the patient with the interpreter and there was no answer. LVM asking the pt to call back.

## 2022-04-11 NOTE — Telephone Encounter (Signed)
-----   Message from Ocie Doyne, MD sent at 04/05/2022 12:36 PM EST ----- Brain MRI shows some mild shrinkage of the brain which is more than would be expected for her age. This is a nonspecific finding but can contribute to memory loss. Otherwise the MRI looks normal. I would like her to see neuropsychology for more extensive memory testing. It looks like [facility] has tried to reach out to her to schedule but has not been able to get a hold of her. She can call them at [phone] to get this scheduled

## 2022-04-16 NOTE — Telephone Encounter (Signed)
Pt niece called, informed her of results.  Advised to call [facility] to get scheduled, number [phone] provided.  She verbally understood and was appreciative.

## 2022-05-21 ENCOUNTER — Other Ambulatory Visit: Payer: Self-pay | Admitting: Family Medicine

## 2022-05-21 DIAGNOSIS — I10 Essential (primary) hypertension: Secondary | ICD-10-CM

## 2022-08-16 ENCOUNTER — Other Ambulatory Visit: Payer: Self-pay | Admitting: Family Medicine

## 2022-08-16 DIAGNOSIS — I10 Essential (primary) hypertension: Secondary | ICD-10-CM

## 2022-08-22 ENCOUNTER — Encounter: Payer: Self-pay | Admitting: Psychiatry

## 2022-08-22 ENCOUNTER — Ambulatory Visit (INDEPENDENT_AMBULATORY_CARE_PROVIDER_SITE_OTHER): Payer: Self-pay | Admitting: Psychiatry

## 2022-08-22 VITALS — BP 125/70 | HR 66 | Ht 64.0 in | Wt 171.0 lb

## 2022-08-22 DIAGNOSIS — R413 Other amnesia: Secondary | ICD-10-CM

## 2022-08-22 MED ORDER — TRAZODONE HCL 50 MG PO TABS: 50.0000 mg | ORAL_TABLET | Freq: Every day | ORAL | 6 refills | Status: DC

## 2022-08-22 MED ORDER — DONEPEZIL HCL 5 MG PO TABS: 5.0000 mg | ORAL_TABLET | Freq: Every day | ORAL | 0 refills | Status: DC

## 2022-08-22 NOTE — Progress Notes (Signed)
   CC:  memory loss  Follow-up Visit  Last visit: 02/22/22  Brief HPI: 62 year old with a history of HTN who follows in clinic for memory loss.   At her last visit, brain MRI was ordered and she was referred for neuropsychological testing.  Interval History: MRI brain showed mild generalized atrophy, disproportionate for her age. Neuropsych testing was never done.  Feels her memory has worsened in the past 6 months. Forgets conversations she's had. Repeats herself more frequently and has asked the same question 5 times in a row. It is worse when she is nervous. She is still working, and will forget if she has done tasks at work or not. Still living alone. Has never left the stove on and double checks this every evening before bed. Only uses the microwave in the morning. Daughter is helping make sure she takes her medications in the morning. No trouble remembering her family or friend's names. She has trouble sleeping at night and will sometimes call her daughter at midnight because she is anxious.   Physical Exam:   Vital Signs: BP 125/70 (BP Location: Right Arm, Patient Position: Sitting, Cuff Size: Normal)   Pulse 66   Ht  (1.626 m)   Wt 171 lb (77.6 kg)   LMP 10/16/2008   BMI 29.35 kg/m  GENERAL:  well appearing, in no acute distress, alert  SKIN:  Color, texture, turgor normal. No rashes or lesions HEAD:  Normocephalic/atraumatic. RESP: normal respiratory effort  NEUROLOGICAL: Mental Status:     08/22/2022   10:01 AM 02/22/2022    9:48 AM  Montreal Cognitive Assessment   Visuospatial/ Executive (0/5) 5 1  Naming (0/3) 2 2  Attention: Read list of digits (0/2) 1 1  Attention: Read list of letters (0/1) 1 1  Attention: Serial 7 subtraction starting at 100 (0/3) 3 0  Language: Repeat phrase (0/2) 2 1  Language : Fluency (0/1) 1 0  Abstraction (0/2) 2 2  Delayed Recall (0/5) 4 0  Orientation (0/6) 3 2  Total 24 10  Adjusted Score (based on education) 25      Cranial Nerves: PERRL, face symmetric, no dysarthria, hearing grossly intact Motor: moves all extremities equally, no tremor or bradykinesia Gait: normal-based.  IMPRESSION: 62 year old female with a history of HTN who presents for follow up of memory loss. MRI brain with mild generalized atrophy, disproportionate to her age. MOCA score is 25/30, which is significantly better than her previous score last October although subjectively she feels her memory is worse. Suspect some of these inconsistencies are due to language barrier. Will place new referral for neuropsychological testing today to help get a better understanding of her memory deficits. Will start donepezil for her memory and low dose trazodone for sleep. Counseled her to start one medication at a time to help identify side effects if they occur.  PLAN: -Referral for neuropsychological testing -Start donepezil 5 mg daily, may uptitrate to 10 mg daily as tolerated -Start trazodone 50 mg QHS for sleep/anxiety   Follow-up: 6 months  I spent a total of 45 minutes on the date of the service. Discussed medication side effects, adverse reactions and drug interactions. Written educational materials and patient instructions outlining all of the above were given.  Ocie Doyne, MD 08/21/22 10:27 AM

## 2022-08-23 ENCOUNTER — Telehealth: Payer: Self-pay | Admitting: Psychiatry

## 2022-08-23 NOTE — Telephone Encounter (Signed)
Neuropsychology referral faxed to [facility] (fax# [phone], phone# [phone]) 

## 2022-09-18 ENCOUNTER — Other Ambulatory Visit: Payer: Self-pay

## 2022-09-18 DIAGNOSIS — R413 Other amnesia: Secondary | ICD-10-CM

## 2022-09-18 MED ORDER — DONEPEZIL HCL 5 MG PO TABS: 5.0000 mg | ORAL_TABLET | Freq: Every day | ORAL | 5 refills | Status: DC

## 2022-09-27 ENCOUNTER — Ambulatory Visit: Payer: Self-pay | Admitting: Psychiatry

## 2022-11-23 ENCOUNTER — Encounter: Payer: Self-pay | Admitting: Family Medicine

## 2022-11-23 ENCOUNTER — Ambulatory Visit (INDEPENDENT_AMBULATORY_CARE_PROVIDER_SITE_OTHER): Payer: Self-pay | Admitting: Family Medicine

## 2022-11-23 VITALS — BP 116/70 | HR 55 | Temp 97.6°F | Wt 177.0 lb

## 2022-11-23 DIAGNOSIS — I10 Essential (primary) hypertension: Secondary | ICD-10-CM

## 2022-11-23 DIAGNOSIS — R7303 Prediabetes: Secondary | ICD-10-CM

## 2022-11-23 DIAGNOSIS — L819 Disorder of pigmentation, unspecified: Secondary | ICD-10-CM

## 2022-11-23 DIAGNOSIS — R413 Other amnesia: Secondary | ICD-10-CM

## 2022-11-23 LAB — CBC WITH DIFFERENTIAL/PLATELET
Absolute Monocytes: 442 cells/uL (ref 200–950)
Eosinophils Relative: 2 %
HCT: 37.9 % (ref 35.0–45.0)
Hemoglobin: 12.5 g/dL (ref 11.7–15.5)
Lymphs Abs: 1703 cells/uL (ref 850–3900)
MCH: 28 pg (ref 27.0–33.0)
MCHC: 33 g/dL (ref 32.0–36.0)
Platelets: 239 10*3/uL (ref 140–400)
RBC: 4.46 10*6/uL (ref 3.80–5.10)
RDW: 12.8 % (ref 11.0–15.0)
Total Lymphocyte: 26.2 %
WBC: 6.5 10*3/uL (ref 3.8–10.8)

## 2022-11-23 MED ORDER — LISINOPRIL-HYDROCHLOROTHIAZIDE 20-25 MG PO TABS: 1.0000 | ORAL_TABLET | Freq: Every day | ORAL | 3 refills | Status: DC

## 2022-11-23 NOTE — Progress Notes (Signed)
Established Patient Office Visit   Subjective  Patient ID: Allison Cook, female    DOB: 12-28-1960  Age: 62 y.o. MRN: [id number]  Chief Complaint  Patient presents with   Medication Refill    LISINOPRIL-HCTZ  Pt accompanied by her granddaughter.  Spanish interpreter present.  Pt is a 62 yo female seen for f/u and med refill.  Pt doing well overall.  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  Taking aricept, but still having issues w/ short term memory.  Pt does well with having a routine.  Repeating some questions more this wk as she is on vacation from work.    Light areas on b/l forearms.  Nonpruritic, nontender.   Needs refill on bp med lisinopril-hydrochlorothiazide 20-25mg  daily.    Past Medical History:  Diagnosis Date   Allergic rhinitis    Anemia, iron deficiency    This has resolved   Arthritis    Generalized, rx with nsaids.   GERD (gastroesophageal reflux disease)    Hypertension    Irregular menstrual bleeding    Otitis externa    Systolic murmur 8/08   There is vigorous LV function. With this, there is slight increase in the gradient across the pulmonic valve.   Varicose veins    Past Surgical History:  Procedure Laterality Date   none     Social History   Tobacco Use   Smoking status: Never   Smokeless tobacco: Never  Substance Use Topics   Alcohol use: No   Drug use: No   Family History  Problem Relation Age of Onset   Hypertension Mother    Allergies  Allergen Reactions   Tramadol Nausea And Vomiting      ROS Negative unless stated above    Objective:     BP 116/70 (BP Location: Right Arm, Patient Position: Sitting, Cuff Size: Normal)   Pulse (!) 55   Temp 97.6 F (36.4 C) (Oral)   Wt 177 lb (80.3 kg)   LMP 10/16/2008   SpO2 98%   BMI 30.38 kg/m  BP Readings from Last 3 Encounters:  11/23/22 116/70  08/22/22 125/70  02/22/22 (!) 109/54   Wt Readings from Last 3 Encounters:   11/23/22 177 lb (80.3 kg)  08/22/22 171 lb (77.6 kg)  02/22/22 176 lb (79.8 kg)      Physical Exam Constitutional:      General: She is not in acute distress.    Appearance: Normal appearance.  HENT:     Head: Normocephalic and atraumatic.     Right Ear: Tympanic membrane normal.     Left Ear: Tympanic membrane normal.     Nose: Nose normal.     Mouth/Throat:     Mouth: Mucous membranes are moist.  Eyes:     Extraocular Movements: Extraocular movements intact.     Conjunctiva/sclera: Conjunctivae normal.     Pupils: Pupils are equal, round, and reactive to light.  Neck:     Vascular: No carotid bruit.  Cardiovascular:     Rate and Rhythm: Normal rate and regular rhythm.     Heart sounds: Normal heart sounds. No murmur heard.    No gallop.  Pulmonary:     Effort: Pulmonary effort is normal. No respiratory distress.     Breath sounds: Normal breath sounds. No wheezing, rhonchi or rales.  Musculoskeletal:        General: Normal range of motion.  Skin:    General: Skin is warm and  dry.     Comments: Hypopigmented round, flat areas on b/l forearms and 2 on upper chest.  Neurological:     Mental Status: She is alert and oriented to person, place, and time. Mental status is at baseline.      No results found for any visits on 11/23/22.    Assessment & Plan:  Essential hypertension -controlled -continue lisinopril-HCTZ -     Lisinopril-hydroCHLOROthiazide; Take 1 tablet by mouth daily.  Dispense: 90 tablet; Refill: 3 -     Comprehensive metabolic panel; Future -     Lipid panel; Future  Memory loss -MRI brain 04/04/22 w/ mild generalized atrophy disproportionate to her age. -continue Aricept 5 mg -continue f/u w/ Neuro -     CBC with Differential/Platelet; Future -     Lipid panel; Future -     TSH; Future  Prediabetes -hgb A1C 6.1% 03/29/21 -     Hemoglobin A1c; Future  Skin hypopigmentation    Return in about 6 months (around 05/26/2023), or if symptoms  worsen or fail to improve.   Deeann Saint, MD

## 2022-11-24 LAB — CBC WITH DIFFERENTIAL/PLATELET
Basophils Absolute: 52 cells/uL (ref 0–200)
Basophils Relative: 0.8 %
Eosinophils Absolute: 130 cells/uL (ref 15–500)
MCV: 85 fL (ref 80.0–100.0)
MPV: 11.1 fL (ref 7.5–12.5)
Monocytes Relative: 6.8 %
Neutro Abs: 4173 cells/uL (ref 1500–7800)
Neutrophils Relative %: 64.2 %

## 2022-11-24 LAB — COMPREHENSIVE METABOLIC PANEL
AG Ratio: 1.7 (calc) (ref 1.0–2.5)
ALT: 22 U/L (ref 6–29)
AST: 24 U/L (ref 10–35)
Albumin: 4.1 g/dL (ref 3.6–5.1)
Alkaline phosphatase (APISO): 63 U/L (ref 37–153)
BUN: 22 mg/dL (ref 7–25)
CO2: 33 mmol/L — ABNORMAL HIGH (ref 20–32)
Calcium: 10 mg/dL (ref 8.6–10.4)
Chloride: 98 mmol/L (ref 98–110)
Creat: 0.82 mg/dL (ref 0.50–1.05)
Globulin: 2.4 g/dL (calc) (ref 1.9–3.7)
Glucose, Bld: 84 mg/dL (ref 65–99)
Potassium: 4 mmol/L (ref 3.5–5.3)
Sodium: 139 mmol/L (ref 135–146)
Total Bilirubin: 0.2 mg/dL (ref 0.2–1.2)
Total Protein: 6.5 g/dL (ref 6.1–8.1)

## 2022-11-24 LAB — LIPID PANEL
Cholesterol: 161 mg/dL (ref <200)
HDL: 71 mg/dL (ref >=50)
LDL Cholesterol (Calc): 72 mg/dL (calc)
Non-HDL Cholesterol (Calc): 90 mg/dL (calc) (ref <130)
Total CHOL/HDL Ratio: 2.3 (calc) (ref <5.0)
Triglycerides: 92 mg/dL (ref <150)

## 2022-11-24 LAB — HEMOGLOBIN A1C
Hgb A1c MFr Bld: 6.1 % of total Hgb — ABNORMAL HIGH (ref <5.7)
Mean Plasma Glucose: 128 mg/dL
eAG (mmol/L): 7.1 mmol/L

## 2022-11-24 LAB — TSH: TSH: 2.45 mIU/L (ref 0.40–4.50)

## 2023-02-08 ENCOUNTER — Other Ambulatory Visit: Payer: Self-pay | Admitting: Family Medicine

## 2023-02-08 DIAGNOSIS — Z1212 Encounter for screening for malignant neoplasm of rectum: Secondary | ICD-10-CM

## 2023-02-08 DIAGNOSIS — Z1211 Encounter for screening for malignant neoplasm of colon: Secondary | ICD-10-CM

## 2023-02-21 ENCOUNTER — Ambulatory Visit: Payer: Self-pay | Admitting: Family Medicine

## 2023-02-28 ENCOUNTER — Telehealth: Payer: Self-pay | Admitting: Family Medicine

## 2023-02-28 NOTE — Telephone Encounter (Signed)
Spoke with the patient's niece per DPR.

## 2023-02-28 NOTE — Telephone Encounter (Signed)
Pt niece is calling   patient received cologuard kit and niece would like to make sure it came from dr banks

## 2023-03-13 ENCOUNTER — Other Ambulatory Visit: Payer: Self-pay | Admitting: *Deleted

## 2023-03-13 DIAGNOSIS — R413 Other amnesia: Secondary | ICD-10-CM

## 2023-03-13 MED ORDER — DONEPEZIL HCL 5 MG PO TABS: 5.0000 mg | ORAL_TABLET | Freq: Every day | ORAL | 2 refills | Status: DC

## 2023-03-13 NOTE — Telephone Encounter (Signed)
Last seen on 08/22/22 Follow up scheduled on 05/29/23

## 2023-05-23 NOTE — Progress Notes (Deleted)
No chief complaint on file.   HISTORY OF PRESENT ILLNESS:  05/23/23 ALL:  Allison Cook is a 63 y.o. female here today for follow up for memory loss. She was last seen by Dr Vickey Huger 08/2022 and started on donepezil 10mg  and trazodone 50mg  at bedtime. Referral placed to neuropsychology.   Since,    HISTORY (copied from Dr Quentin Mulling previous note)  Brief HPI: 63 year old with a history of HTN who follows in clinic for memory loss.    At her last visit, brain MRI was ordered and she was referred for neuropsychological testing.   Interval History: MRI brain showed mild generalized atrophy, disproportionate for her age. Neuropsych testing was never done.   Feels her memory has worsened in the past 6 months. Forgets conversations she's had. Repeats herself more frequently and has asked the same question 5 times in a row. It is worse when she is nervous. She is still working, and will forget if she has done tasks at work or not. Still living alone. Has never left the stove on and double checks this every evening before bed. Only uses the microwave in the morning. Daughter is helping make sure she takes her medications in the morning. No trouble remembering her family or friend's names. She has trouble sleeping at night and will sometimes call her daughter at midnight because she is anxious.   REVIEW OF SYSTEMS: Out of a complete 14 system review of symptoms, the patient complains only of the following symptoms, and all other reviewed systems are negative.   ALLERGIES: Allergies  Allergen Reactions   Tramadol Nausea And Vomiting     HOME MEDICATIONS: Outpatient Medications Prior to Visit  Medication Sig Dispense Refill   acetaminophen (TYLENOL) 650 MG suppository Place 650 mg rectally every 8 (eight) hours as needed for fever.     ASPIRIN PO Take by mouth.     cyclobenzaprine (FLEXERIL) 5 MG tablet Take 1 tablet (5 mg total) by mouth at bedtime as needed for muscle spasms.  (Patient not taking: Reported on 11/23/2022) 30 tablet 0   donepezil (ARICEPT) 5 MG tablet Take 1 tablet (5 mg total) by mouth daily with breakfast. 30 tablet 2   ibuprofen (ADVIL,MOTRIN) 200 MG tablet Take 200 mg by mouth every 6 (six) hours as needed.     lisinopril-hydrochlorothiazide (ZESTORETIC) 20-25 MG tablet Take 1 tablet by mouth daily. 90 tablet 3   traZODone (DESYREL) 50 MG tablet Take 1 tablet (50 mg total) by mouth at bedtime. 30 tablet 6   VITAMIN D PO Take by mouth. (Patient not taking: Reported on 11/23/2022)     Vitamin D, Ergocalciferol, (DRISDOL) 1.25 MG (50000 UNIT) CAPS capsule Take 1 capsule (50,000 Units total) by mouth every 7 (seven) days. (Patient not taking: Reported on 11/23/2022) 12 capsule 0   No facility-administered medications prior to visit.     PAST MEDICAL HISTORY: Past Medical History:  Diagnosis Date   Allergic rhinitis    Anemia, iron deficiency    This has resolved   Arthritis    Generalized, rx with nsaids.   GERD (gastroesophageal reflux disease)    Hypertension    Irregular menstrual bleeding    Otitis externa    Systolic murmur 8/08   There is vigorous LV function. With this, there is slight increase in the gradient across the pulmonic valve.   Varicose veins      PAST SURGICAL HISTORY: Past Surgical History:  Procedure Laterality Date   none  FAMILY HISTORY: Family History  Problem Relation Age of Onset   Hypertension Mother      SOCIAL HISTORY: Social History   Socioeconomic History   Marital status: Single    Spouse name: Not on file   Number of children: 0   Years of education: Not on file   Highest education level: Not on file  Occupational History   Occupation: hotel housekeeper  Tobacco Use   Smoking status: Never   Smokeless tobacco: Never  Substance and Sexual Activity   Alcohol use: No   Drug use: No   Sexual activity: Not Currently  Other Topics Concern   Not on file  Social History Narrative    Single immigrated from [place] in 1999. Lives at home by self. 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    Social Drivers of Corporate investment banker Strain: Not on file  Food Insecurity: Not on file  Transportation Needs: Not on file  Physical Activity: Not on file  Stress: Not on file  Social Connections: Not on file  Intimate Partner Violence: Not on file     PHYSICAL EXAM  There were no vitals filed for this visit. There is no height or weight on file to calculate BMI.  Generalized: Well developed, in no acute distress  Cardiology: normal rate and rhythm, no murmur auscultated  Respiratory: clear to auscultation bilaterally    Neurological examination  Mentation: Alert oriented to time, place, history taking. Follows all commands speech and language fluent Cranial nerve II-XII: Pupils were equal round reactive to light. Extraocular movements were full, visual field were full on confrontational test. Facial sensation and strength were normal. Uvula tongue midline. Head turning and shoulder shrug  were normal and symmetric. Motor: The motor testing reveals 5 over 5 strength of all 4 extremities. Good symmetric motor tone is noted throughout.  Sensory: Sensory testing is intact to soft touch on all 4 extremities. No evidence of extinction is noted.  Coordination: Cerebellar testing reveals good finger-nose-finger and heel-to-shin bilaterally.  Gait and station: Gait is normal. Tandem gait is normal. Romberg is negative. No drift is seen.  Reflexes: Deep tendon reflexes are symmetric and normal bilaterally.    DIAGNOSTIC DATA (LABS, IMAGING, TESTING) - I reviewed patient records, labs, notes, testing and imaging myself where available.  Lab Results  Component Value Date   WBC 6.5 11/23/2022   HGB 12.5 11/23/2022   HCT 37.9 11/23/2022   MCV 85.0 11/23/2022   PLT 239 11/23/2022      Component Value Date/Time   NA 139 11/23/2022 1140   K 4.0 11/23/2022 1140   CL 98  11/23/2022 1140   CO2 33 (H) 11/23/2022 1140   GLUCOSE 84 11/23/2022 1140   BUN 22 11/23/2022 1140   CREATININE 0.82 11/23/2022 1140   CALCIUM 10.0 11/23/2022 1140   PROT 6.5 11/23/2022 1140   ALBUMIN 4.3 03/29/2021 1414   AST 24 11/23/2022 1140   ALT 22 11/23/2022 1140   ALKPHOS 82 03/29/2021 1414   BILITOT 0.2 11/23/2022 1140   GFRNONAA 83 03/07/2020 1044   GFRAA 96 03/07/2020 1044   Lab Results  Component Value Date   CHOL 161 11/23/2022   HDL 71 11/23/2022   LDLCALC 72 11/23/2022   TRIG 92 11/23/2022   CHOLHDL 2.3 11/23/2022   Lab Results  Component Value Date   HGBA1C 6.1 (H) 11/23/2022   Lab Results  Component Value Date   VITAMINB12 1,085 (H) 11/17/2021   Lab Results  Component Value Date   TSH 2.45 11/23/2022        No data to display              08/22/2022   10:01 AM 02/22/2022    9:48 AM  Montreal Cognitive Assessment   Visuospatial/ Executive (0/5) 5 1  Naming (0/3) 2 2  Attention: Read list of digits (0/2) 1 1  Attention: Read list of letters (0/1) 1 1  Attention: Serial 7 subtraction starting at 100 (0/3) 3 0  Language: Repeat phrase (0/2) 2 1  Language : Fluency (0/1) 1 0  Abstraction (0/2) 2 2  Delayed Recall (0/5) 4 0  Orientation (0/6) 3 2  Total 24 10  Adjusted Score (based on education) 25      ASSESSMENT AND PLAN  63 y.o. year old female  has a past medical history of Allergic rhinitis, Anemia, iron deficiency, Arthritis, GERD (gastroesophageal reflux disease), Hypertension, Irregular menstrual bleeding, Otitis externa, Systolic murmur (1/61), and Varicose veins. here with    No diagnosis found.  Temple Pacini ***.  Healthy lifestyle habits encouraged. *** will follow up with PCP as directed. *** will return to see me in ***, sooner if needed. *** verbalizes understanding and agreement with this plan.   No orders of the defined types were placed in this encounter.    No orders of the defined types were placed  in this encounter.    Shawnie Dapper, MSN, FNP-C 05/23/2023, 4:53 PM  [facility] [address] [place], [place] [address] [phone]

## 2023-05-24 ENCOUNTER — Ambulatory Visit: Payer: Self-pay | Admitting: Family Medicine

## 2023-05-28 ENCOUNTER — Telehealth: Payer: Self-pay | Admitting: Psychiatry

## 2023-05-28 NOTE — Telephone Encounter (Signed)
Pt' niece called to reschedule appointment due to patient was not able to get off work

## 2023-05-29 ENCOUNTER — Ambulatory Visit: Payer: Self-pay | Admitting: Family Medicine

## 2023-06-27 ENCOUNTER — Encounter: Payer: Self-pay | Admitting: Family Medicine

## 2023-06-27 ENCOUNTER — Ambulatory Visit: Payer: Self-pay | Admitting: Family Medicine

## 2023-06-27 VITALS — BP 110/74 | HR 74 | Temp 97.6°F | Ht 64.0 in | Wt 174.8 lb

## 2023-06-27 DIAGNOSIS — I10 Essential (primary) hypertension: Secondary | ICD-10-CM

## 2023-06-27 DIAGNOSIS — M25562 Pain in left knee: Secondary | ICD-10-CM

## 2023-06-27 DIAGNOSIS — R413 Other amnesia: Secondary | ICD-10-CM

## 2023-06-27 DIAGNOSIS — R7303 Prediabetes: Secondary | ICD-10-CM

## 2023-06-27 DIAGNOSIS — M1990 Unspecified osteoarthritis, unspecified site: Secondary | ICD-10-CM

## 2023-06-27 LAB — POCT GLYCOSYLATED HEMOGLOBIN (HGB A1C): Hemoglobin A1C: 5.7 % — AB (ref 4.0–5.6)

## 2023-06-27 MED ORDER — MELOXICAM 7.5 MG PO TABS: 7.5000 mg | ORAL_TABLET | Freq: Every day | ORAL | 0 refills | Status: DC

## 2023-06-27 MED ORDER — DONEPEZIL HCL 5 MG PO TABS: 5.0000 mg | ORAL_TABLET | Freq: Every day | ORAL | 0 refills | Status: DC

## 2023-06-27 MED ORDER — LISINOPRIL-HYDROCHLOROTHIAZIDE 20-25 MG PO TABS: 1.0000 | ORAL_TABLET | Freq: Every day | ORAL | 3 refills | Status: DC

## 2023-06-27 NOTE — Patient Instructions (Addendum)
You can find Voltaren gel over-the-counter at your local drugstore, [employer], [employer], etc.  It is a gel that you can use up to 3 times a day for knee pain due to arthritis.  The order for an x-ray of your left knee was placed.  You can have this done in clinic at your convenience.  I would recommend calling the clinic to make sure we have x-ray available the day you wish to come.  Your A1c has improved.  It was 6.1% in July 2024.  It is now 5.7%.  This is almost in the normal range.  Normal is 5.6% or less.

## 2023-06-27 NOTE — Progress Notes (Signed)
Established Patient Office Visit   Subjective  Patient ID: Allison Cook, female    DOB: Oct 14, 1960  Age: 63 y.o. MRN: [id number]  Chief Complaint  Patient presents with   Medical Management of Chronic Issues    6 month follow-up for Pre-DM, BP, Memory,    Leg Pain    Left leg and knee pain, knee is the worse, started 2 days ago, Denies any injury, rate of pain 9 out of 10, daughter states there is some swelling   Pt accompanied by her granddaughter and Spanish interpreter.  Patient is a 63 year old female seen for follow-up.  Patient states she has been doing well overall.  Started having left knee pain x 2 weeks.  Worse with standing and walking.  Denies edema, popping, grinding, instability.  Taking ibuprofen 800 mg in a.m. and Tylenol 500 mg x 2 later in the day.  Also tried lidocaine cream and brace.  In the past was told had arthritis in left knee.  Patient seen by neurology for memory concerns.  Started on Aricept 5 mg.  Unable to get refills as waiting on appointment with new provider as previous neurologist no longer our practice.  Requesting refill on BP med.  A1c 6.1% July 2024.  Patient does not eat much rice and beans, other carbs, or sweets.    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   GERD (gastroesophageal reflux disease)    Hypertension    Irregular menstrual bleeding    Otitis externa     Systolic murmur 8/08   There is vigorous LV function. With this, there is slight increase in the gradient across the pulmonic valve.   Varicose veins    Past Surgical History:  Procedure Laterality Date   none     Social History   Tobacco Use   Smoking status: Never   Smokeless tobacco: Never  Substance Use Topics   Alcohol use: No   Drug use: No   Family History  Problem Relation Age of Onset   Hypertension Mother    Allergies  Allergen Reactions   Tramadol Nausea And Vomiting      ROS Negative unless stated above    Objective:     BP 110/74 (BP Location: Left Arm, Patient Position: Sitting, Cuff Size: Large)   Pulse 74   Temp 97.6 F (36.4 C) (Oral)   Ht 5\' 4"  (1.626 m)   Wt 174 lb 12.8 oz (79.3 kg)   LMP 10/16/2008   SpO2 97%   BMI 30.00 kg/m  BP Readings from Last 3 Encounters:  06/27/23 110/74  11/23/22 116/70  08/22/22 125/70   Wt Readings from Last 3 Encounters:  06/27/23 174 lb 12.8 oz (79.3 kg)  11/23/22 177 lb (80.3 kg)  08/22/22 171 lb (77.6 kg)      Physical Exam Constitutional:      General: She is not  in acute distress.    Appearance: Normal appearance.  HENT:     Head: Normocephalic and atraumatic.     Nose: Nose normal.     Mouth/Throat:     Mouth: Mucous membranes are moist.  Cardiovascular:     Rate and Rhythm: Normal rate and regular rhythm.     Heart sounds: Normal heart sounds. No murmur heard.    No gallop.  Pulmonary:     Effort: Pulmonary effort is normal. No respiratory distress.     Breath sounds: Normal breath sounds. No wheezing, rhonchi or rales.  Musculoskeletal:     Right knee: Swelling present. No effusion or erythema. Tenderness present.     Left knee: No swelling, effusion or erythema. Decreased range of motion.       Legs:     Comments: Right knee >L knee.  No crepitus of bilateral knees.  TTP superior to lateral joint line of right knee.  Left knee limited ROM, comfort at 90 degrees.  No patellar tendon  laxity.  Negative joint instability.  No masses in popliteal fossa.  Skin:    General: Skin is warm and dry.  Neurological:     Mental Status: She is alert and oriented to person, place, and time.      Results for orders placed or performed in visit on 06/27/23  POC HgB A1c  Result Value Ref Range   Hemoglobin A1C 5.7 (A) 4.0 - 5.6 %   HbA1c POC (<> result, manual entry)     HbA1c, POC (prediabetic range)     HbA1c, POC (controlled diabetic range)        Assessment & Plan:  Acute pain of left knee -Discussed possible causes including arthritis, less likely gout, sprain-less likely as no injury reported. -Discussed obtaining imaging when available.  Order placed for x-ray. -Discussed supportive care including heat, compression -OTC Voltaren gel 3 times daily as needed -Decrease NSAID use.  Will try Mobic.  Okay to continue Tylenol if needed. -     Meloxicam; Take 1 tablet (7.5 mg total) by mouth daily.  Dispense: 30 tablet; Refill: 0 -     DG Knee Complete 4 Views Left; Future  Prediabetes -Hemoglobin A1c 6.1% on 11/2022 -Recheck this visit 5.7%. -Continue lifestyle modifications -     POCT glycosylated hemoglobin (Hb A1C)  Memory loss -Stable -Continue donepezil 5 mg daily. -Encouraged to schedule appointment with new neurologist -     Donepezil HCl; Take 1 tablet (5 mg total) by mouth daily with breakfast.  Dispense: 90 tablet; Refill: 0  Essential hypertension -Controlled -Continue lifestyle modifications -Continue lisinopril-hydrochlorothiazide 20-25 mg daily. -     Lisinopril-hydroCHLOROthiazide; Take 1 tablet by mouth daily.  Dispense: 90 tablet; Refill: 3  Arthritis -Advised left knee pain likely 2/2 arthritis. -Will obtain imaging.  X-ray unavailable in clinic this visit due to weather. -X-ray ordered, to be scheduled at patient's convenience. -Discussed OTC Voltaren gel 3 times daily as needed -Will try Mobic for symptoms.  Advised not to take ibuprofen with  Mobic. -Can continue Tylenol as needed if needed as well as heat. -Discussed other options such as injections if needed but patient declines at this time. -     Meloxicam; Take 1 tablet (7.5 mg total) by mouth daily.  Dispense: 30 tablet; Refill: 0 -     DG Knee Complete 4 Views Left; Future  Advised to consider mammogram.  Return in about 5 months (around 11/24/2023).   Deeann Saint, MD

## 2023-07-04 ENCOUNTER — Ambulatory Visit: Payer: Self-pay | Admitting: Family Medicine

## 2023-07-21 ENCOUNTER — Other Ambulatory Visit: Payer: Self-pay | Admitting: Family Medicine

## 2023-07-21 DIAGNOSIS — M1990 Unspecified osteoarthritis, unspecified site: Secondary | ICD-10-CM

## 2023-07-21 DIAGNOSIS — M25562 Pain in left knee: Secondary | ICD-10-CM

## 2023-11-18 ENCOUNTER — Ambulatory Visit (INDEPENDENT_AMBULATORY_CARE_PROVIDER_SITE_OTHER): Payer: Self-pay | Admitting: Family Medicine

## 2023-11-18 ENCOUNTER — Encounter: Payer: Self-pay | Admitting: Family Medicine

## 2023-11-18 VITALS — BP 124/73 | HR 69 | Ht 62.0 in | Wt 187.0 lb

## 2023-11-18 DIAGNOSIS — R413 Other amnesia: Secondary | ICD-10-CM

## 2023-11-18 MED ORDER — DONEPEZIL HCL 10 MG PO TABS: 10.0000 mg | ORAL_TABLET | Freq: Every day | ORAL | 3 refills | Status: AC

## 2023-11-18 NOTE — Patient Instructions (Signed)
 Below is our plan:  We will increase donepezil  to 10mg  daily.   Please make sure you are staying well hydrated. I recommend 50-60 ounces daily. Well balanced diet and regular exercise encouraged. Consistent sleep schedule with 6-8 hours recommended.   Please continue follow up with care team as directed.   Follow up with me in 6 months   You may receive a survey regarding today's visit. I encourage you to leave honest feed back as I do use this information to improve patient care. Thank you for seeing me today!   Management of Memory Problems   There are some general things you can do to help manage your memory problems.  Your memory may not in fact recover, but by using techniques and strategies you will be able to manage your memory difficulties better.   1)  Establish a routine. Try to establish and then stick to a regular routine.  By doing this, you will get used to what to expect and you will reduce the need to rely on your memory.  Also, try to do things at the same time of day, such as taking your medication or checking your calendar first thing in the morning. Think about think that you can do as a part of a regular routine and make a list.  Then enter them into a daily planner to remind you.  This will help you establish a routine.   2)  Organize your environment. Organize your environment so that it is uncluttered.  Decrease visual stimulation.  Place everyday items such as keys or cell phone in the same place every day (ie.  Basket next to front door) Use post it notes with a brief message to yourself (ie. Turn off light, lock the door) Use labels to indicate where things go (ie. Which cupboards are for food, dishes, etc.) Keep a notepad and pen by the telephone to take messages   3)  Memory Aids A diary or journal/notebook/daily planner Making a list (shopping list, chore list, to do list that needs to be done) Using an alarm as a reminder (kitchen timer or cell phone  alarm) Using cell phone to store information (Notes, Calendar, Reminders) Calendar/White board placed in a prominent position Post-it notes   In order for memory aids to be useful, you need to have good habits.  It's no good remembering to make a note in your journal if you don't remember to look in it.  Try setting aside a certain time of day to look in journal.   4)  Improving mood and managing fatigue. There may be other factors that contribute to memory difficulties.  Factors, such as anxiety, depression and tiredness can affect memory. Regular gentle exercise can help improve your mood and give you more energy. Exercise: there are short videos created by the [employer] on Health specially for older adults: https://bit.ly/2I30q97.  Mediterranean diet: which emphasizes fruits, vegetables, whole grains, legumes, fish, and other seafood; unsaturated fats such as olive oils; and low amounts of red meat, eggs, and sweets. A variation of this, called MIND (Mediterranean-DASH Intervention for Neurodegenerative Delay) incorporates the DASH (Dietary Approaches to Stop Hypertension) diet, which has been shown to lower high blood pressure, a risk factor for Alzheimer's disease. More information at: ExitMarketing.de.  Aerobic exercise that improve heart health is also good for the mind.  [employer] on Aging have short videos for exercises that you can do at home: BlindWorkshop.com.pt Simple relaxation techniques may help relieve symptoms  of anxiety Try to get back to completing activities or hobbies you enjoyed doing in the past. Learn to pace yourself through activities to decrease fatigue. Find out about some local support groups where you can share experiences with others. Try and achieve 7-8 hours of sleep at night.   Tasks to improve attention/working memory 1. Good sleep hygiene (7-8 hrs of sleep) 2.  Learning a new skill (Painting, Carpentry, Pottery, new language, Knitting). 3.Cognitive exercises (keep a daily journal, Puzzles) 4. Physical exercise and training  (30 min/day X 4 days week) 5. Being on Antidepressant if needed 6.Yoga, Meditation, Tai Chi 7. Decrease alcohol intake 8.Have a clear schedule and structure in daily routine   MIND Diet: The Mediterranean-DASH Diet Intervention for Neurodegenerative Delay, or MIND diet, targets the health of the aging brain. Research participants with the highest MIND diet scores had a significantly slower rate of cognitive decline compared with those with the lowest scores. The effects of the MIND diet on cognition showed greater effects than either the Mediterranean or the DASH diet alone.   The healthy items the MIND diet guidelines suggest include:   3+ servings a day of whole grains 1+ servings a day of vegetables (other than green leafy) 6+ servings a week of green leafy vegetables 5+ servings a week of nuts 4+ meals a week of beans 2+ servings a week of berries 2+ meals a week of poultry 1+ meals a week of fish Mainly olive oil if added fat is used   The unhealthy items, which are higher in saturated and trans fat, include: Less than 5 servings a week of pastries and sweets Less than 4 servings a week of red meat (including beef, pork, lamb, and products made from these meats) Less than one serving a week of cheese and fried foods Less than 1 tablespoon a day of butter/stick margarine

## 2023-11-18 NOTE — Progress Notes (Signed)
 Chief Complaint  Patient presents with   RM1/MEMORY    Pt is here with her Niece and Interpreter. Pt states that her memory has gotten worse, she is repeating herself a lot and has a hard time remembering things. Pt states she has a hard time remembering day to day things or task. Pt has moved in with Niece and has cut her days back with working.      HISTORY OF PRESENT ILLNESS:  11/18/23 ALL:  Allison Cook is a 63 y.o. female here today for follow up for memory loss. She was last seen by Allison Cook 08/2022. MRI showed generalized atrophy. Neurocognitive testing ordered but not performed. She was started on donepezil  and trazodone .   Since, she reports continued progression of memory loss. She presents with her niece, Allison Cook, today who aides in history. She has continues donepezil  and tolerating well. Did not continue trazodone . Did not hear back from neurocognitive testing. She is repetitive in questioning. She continues to work but reports hours were reduced due to memory loss. She continues all ADLs independently. She manages her medications but little oversight from family. Now living with her brother/sister and niece/nephews. She does not drive. She is able to navigate the transportation system to get home from work. Family has GPS on her phone. She is sleeping well. Eating normally.    HISTORY (copied from Allison Cook previous note)  63 year old with a history of HTN who follows in clinic for memory loss.    At her last visit, brain MRI was ordered and she was referred for neuropsychological testing.   Interval History: MRI brain showed mild generalized atrophy, disproportionate for her age. Neuropsych testing was never done.   Feels her memory has worsened in the past 6 months. Forgets conversations she's had. Repeats herself more frequently and has asked the same question 5 times in a row. It is worse when she is nervous. She is still working, and will forget if she has  done tasks at work or not. Still living alone. Has never left the stove on and double checks this every evening before bed. Only uses the microwave in the morning. Daughter is helping make sure she takes her medications in the morning. No trouble remembering her family or friend's names. She has trouble sleeping at night and will sometimes call her daughter at midnight because she is anxious.   REVIEW OF SYSTEMS: Out of a complete 14 system review of symptoms, the patient complains only of the following symptoms, memory loss, and all other reviewed systems are negative.   ALLERGIES: Allergies  Allergen Reactions   Tramadol  Nausea And Vomiting     HOME MEDICATIONS: Outpatient Medications Prior to Visit  Medication Sig Dispense Refill   ASPIRIN  PO Take by mouth.     ibuprofen (ADVIL,MOTRIN) 200 MG tablet Take 200 mg by mouth every 6 (six) hours as needed.     lisinopril -hydrochlorothiazide  (ZESTORETIC ) 20-25 MG tablet Take 1 tablet by mouth daily. 90 tablet 3   meloxicam  (MOBIC ) 7.5 MG tablet Take 1 tablet by mouth once daily 90 tablet 0   donepezil  (ARICEPT ) 5 MG tablet Take 1 tablet (5 mg total) by mouth daily with breakfast. 90 tablet 0   acetaminophen  (TYLENOL ) 650 MG suppository Place 650 mg rectally every 8 (eight) hours as needed for fever. (Patient not taking: Reported on 11/18/2023)     cyclobenzaprine  (FLEXERIL ) 5 MG tablet Take 1 tablet (5 mg total) by mouth at bedtime as needed  for muscle spasms. (Patient not taking: Reported on 11/18/2023) 30 tablet 0   No facility-administered medications prior to visit.     PAST MEDICAL HISTORY: Past Medical History:  Diagnosis Date   Allergic rhinitis    Anemia, iron deficiency    This has resolved   Arthritis    Generalized, rx with nsaids.   GERD (gastroesophageal reflux disease)    Hypertension    Irregular menstrual bleeding    Otitis externa    Systolic murmur 8/08   There is vigorous LV function. With this, there is slight  increase in the gradient across the pulmonic valve.   Varicose veins      PAST SURGICAL HISTORY: Past Surgical History:  Procedure Laterality Date   none       FAMILY HISTORY: Family History  Problem Relation Age of Onset   Hypertension Mother      SOCIAL HISTORY: Social History   Socioeconomic History   Marital status: Single    Spouse name: Not on file   Number of children: 0   Years of education: Not on file   Highest education level: Not on file  Occupational History   Occupation: hotel housekeeper  Tobacco Use   Smoking status: Never   Smokeless tobacco: Never  Substance and Sexual Activity   Alcohol use: No   Drug use: No   Sexual activity: Not Currently  Other Topics Concern   Not on file  Social History Narrative   Single immigrated from [place] in 1999. Lives at home by self. 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    Social Drivers of Corporate investment banker Strain: Not on file  Food Insecurity: Not on file  Transportation Needs: Not on file  Physical Activity: Not on file  Stress: Not on file  Social Connections: Not on file  Intimate Partner Violence: Not on file     PHYSICAL EXAM  Vitals:   11/18/23 1258  BP: 124/73  Pulse: 69  SpO2: 99%  Weight: 187 lb (84.8 kg)  Height: 5' 2 (1.575 m)   Body mass index is 34.2 kg/m.  Generalized: Well developed, in no acute distress  Cardiology: normal rate and rhythm, no murmur auscultated  Respiratory: clear to auscultation bilaterally    Neurological examination  Mentation: Alert, not oriented to time but she is to place and some history taking. Follows all commands speech and language fluent Cranial nerve II-XII: Pupils were equal round reactive to light. Extraocular movements were full, visual field were full on confrontational test. Facial sensation and strength were normal. Uvula tongue midline. Head turning and shoulder shrug  were normal and symmetric. Motor: The motor testing reveals 5  over 5 strength of all 4 extremities. Good symmetric motor tone is noted throughout.  Gait and station: Gait is normal.    DIAGNOSTIC DATA (LABS, IMAGING, TESTING) - I reviewed patient records, labs, notes, testing and imaging myself where available.  Lab Results  Component Value Date   WBC 6.5 11/23/2022   HGB 12.5 11/23/2022   HCT 37.9 11/23/2022   MCV 85.0 11/23/2022   PLT 239 11/23/2022      Component Value Date/Time   NA 139 11/23/2022 1140   K 4.0 11/23/2022 1140   CL 98 11/23/2022 1140   CO2 33 (H) 11/23/2022 1140   GLUCOSE 84 11/23/2022 1140   BUN 22 11/23/2022 1140   CREATININE 0.82 11/23/2022 1140   CALCIUM 10.0 11/23/2022 1140   PROT 6.5 11/23/2022 1140  ALBUMIN 4.3 03/29/2021 1414   AST 24 11/23/2022 1140   ALT 22 11/23/2022 1140   ALKPHOS 82 03/29/2021 1414   BILITOT 0.2 11/23/2022 1140   GFRNONAA 83 03/07/2020 1044   GFRAA 96 03/07/2020 1044   Lab Results  Component Value Date   CHOL 161 11/23/2022   HDL 71 11/23/2022   LDLCALC 72 11/23/2022   TRIG 92 11/23/2022   CHOLHDL 2.3 11/23/2022   Lab Results  Component Value Date   HGBA1C 5.7 (A) 06/27/2023   Lab Results  Component Value Date   VITAMINB12 1,085 (H) 11/17/2021   Lab Results  Component Value Date   TSH 2.45 11/23/2022       11/18/2023    1:31 PM  MMSE - Mini Mental State Exam  Orientation to time 2  Orientation to Place 3  Registration 3  Attention/ Calculation 1  Recall 0  Language- name 2 objects 2  Language- repeat 0  Language- follow 3 step command 3  Language- read & follow direction 1  Write a sentence 1  Copy design 1  Total score 17        08/22/2022   10:01 AM 02/22/2022    9:48 AM  Montreal Cognitive Assessment   Visuospatial/ Executive (0/5) 5 1  Naming (0/3) 2 2  Attention: Read list of digits (0/2) 1 1  Attention: Read list of letters (0/1) 1 1  Attention: Serial 7 subtraction starting at 100 (0/3) 3 0  Language: Repeat phrase (0/2) 2 1  Language :  Fluency (0/1) 1 0  Abstraction (0/2) 2 2  Delayed Recall (0/5) 4 0  Orientation (0/6) 3 2  Total 24 10  Adjusted Score (based on education) 25      ASSESSMENT AND PLAN  63 y.o. year old female  has a past medical history of Allergic rhinitis, Anemia, iron deficiency, Arthritis, GERD (gastroesophageal reflux disease), Hypertension, Irregular menstrual bleeding, Otitis externa, Systolic murmur (1/91), and Varicose veins. here with    Memory loss - Plan: donepezil  (ARICEPT ) 10 MG tablet  Laurell Coalson has noted progressive memory loss. We will increase donepezil  to 10mg  daily. We discussed formal neurocognitive testing, however, it will likely not change treatment plan. Will hold off for now. We have discussed safety considerations. Healthy lifestyle habits encouraged. She will follow up with PCP as directed. She will return to see me in 6 months, sooner if needed. She and her niece verbalize understanding and agreement with this plan. Allison Cook assists with interpreting.   No orders of the defined types were placed in this encounter.    Meds ordered this encounter  Medications   donepezil  (ARICEPT ) 10 MG tablet    Sig: Take 1 tablet (10 mg total) by mouth daily with breakfast.    Dispense:  90 tablet    Refill:  3    Supervising Provider:   AHERN, ANTONIA B [[id number]]   I personally spent a total of 45 minutes in the care of the patient today including preparing to see the patient, getting/reviewing separately obtained history, performing a medically appropriate exam/evaluation, counseling and educating, placing orders, and documenting clinical information in the EHR.   Greig Forbes, MSN, FNP-C 11/18/2023, 1:56 PM  [facility] [address] [place], [place] [address] [phone]

## 2023-11-25 ENCOUNTER — Ambulatory Visit: Payer: Self-pay | Admitting: Family Medicine

## 2023-11-26 ENCOUNTER — Encounter: Payer: Self-pay | Admitting: Family Medicine

## 2023-11-29 ENCOUNTER — Encounter: Payer: Self-pay | Admitting: Family Medicine

## 2023-11-29 ENCOUNTER — Ambulatory Visit: Payer: Self-pay

## 2023-11-29 ENCOUNTER — Ambulatory Visit: Payer: Self-pay | Admitting: Family Medicine

## 2023-11-29 VITALS — BP 118/64 | HR 67 | Temp 97.7°F | Ht 62.0 in | Wt 182.4 lb

## 2023-11-29 DIAGNOSIS — M25562 Pain in left knee: Secondary | ICD-10-CM

## 2023-11-29 DIAGNOSIS — R6884 Jaw pain: Secondary | ICD-10-CM

## 2023-11-29 DIAGNOSIS — M25561 Pain in right knee: Secondary | ICD-10-CM

## 2023-11-29 DIAGNOSIS — Z1231 Encounter for screening mammogram for malignant neoplasm of breast: Secondary | ICD-10-CM

## 2023-11-29 DIAGNOSIS — G8929 Other chronic pain: Secondary | ICD-10-CM

## 2023-11-29 DIAGNOSIS — Z23 Encounter for immunization: Secondary | ICD-10-CM

## 2023-11-29 DIAGNOSIS — R413 Other amnesia: Secondary | ICD-10-CM

## 2023-11-29 DIAGNOSIS — I10 Essential (primary) hypertension: Secondary | ICD-10-CM

## 2023-11-29 DIAGNOSIS — R7303 Prediabetes: Secondary | ICD-10-CM

## 2023-11-29 LAB — CBC WITH DIFFERENTIAL/PLATELET
Basophils Absolute: 0.1 K/uL (ref 0.0–0.1)
Basophils Relative: 0.7 % (ref 0.0–3.0)
Eosinophils Absolute: 0.2 K/uL (ref 0.0–0.7)
Eosinophils Relative: 2.4 % (ref 0.0–5.0)
HCT: 39.4 % (ref 36.0–46.0)
Hemoglobin: 12.8 g/dL (ref 12.0–15.0)
Lymphocytes Relative: 24.4 % (ref 12.0–46.0)
Lymphs Abs: 1.8 K/uL (ref 0.7–4.0)
MCHC: 32.5 g/dL (ref 30.0–36.0)
MCV: 85 fl (ref 78.0–100.0)
Monocytes Absolute: 0.6 K/uL (ref 0.1–1.0)
Monocytes Relative: 8.5 % (ref 3.0–12.0)
Neutro Abs: 4.6 K/uL (ref 1.4–7.7)
Neutrophils Relative %: 64 % (ref 43.0–77.0)
Platelets: 228 K/uL (ref 150.0–400.0)
RBC: 4.63 Mil/uL (ref 3.87–5.11)
RDW: 13.5 % (ref 11.5–15.5)
WBC: 7.3 K/uL (ref 4.0–10.5)

## 2023-11-29 LAB — HEMOGLOBIN A1C: Hgb A1c MFr Bld: 6.1 % (ref 4.6–6.5)

## 2023-11-29 LAB — TSH: TSH: 1.62 u[IU]/mL (ref 0.35–5.50)

## 2023-11-29 LAB — COMPREHENSIVE METABOLIC PANEL WITH GFR
ALT: 13 U/L (ref 0–35)
AST: 14 U/L (ref 0–37)
Albumin: 4.4 g/dL (ref 3.5–5.2)
Alkaline Phosphatase: 69 U/L (ref 39–117)
BUN: 31 mg/dL — ABNORMAL HIGH (ref 6–23)
CO2: 31 meq/L (ref 19–32)
Calcium: 10.1 mg/dL (ref 8.4–10.5)
Chloride: 97 meq/L (ref 96–112)
Creatinine, Ser: 1.01 mg/dL (ref 0.40–1.20)
GFR: 59.56 mL/min — ABNORMAL LOW (ref >60.00)
Glucose, Bld: 97 mg/dL (ref 70–99)
Potassium: 3.9 meq/L (ref 3.5–5.1)
Sodium: 137 meq/L (ref 135–145)
Total Bilirubin: 0.4 mg/dL (ref 0.2–1.2)
Total Protein: 7.6 g/dL (ref 6.0–8.3)

## 2023-11-29 LAB — T4, FREE: Free T4: 1.24 ng/dL (ref 0.60–1.60)

## 2023-11-29 LAB — LIPID PANEL
Cholesterol: 168 mg/dL (ref 0–200)
HDL: 67.5 mg/dL (ref >39.00)
LDL Cholesterol: 82 mg/dL (ref 0–99)
NonHDL: 100.35
Total CHOL/HDL Ratio: 2
Triglycerides: 92 mg/dL (ref 0.0–149.0)
VLDL: 18.4 mg/dL (ref 0.0–40.0)

## 2023-11-29 LAB — VITAMIN B12: Vitamin B-12: 339 pg/mL (ref 211–911)

## 2023-11-29 MED ORDER — AMOXICILLIN 500 MG PO TABS: 500.0000 mg | ORAL_TABLET | Freq: Two times a day (BID) | ORAL | 0 refills | Status: AC

## 2023-11-29 NOTE — Progress Notes (Signed)
 Established Patient Office Visit   Subjective  Patient ID: Allison Cook, female    DOB: 15-Feb-1961  Age: 63 y.o. MRN: [id number]  Chief Complaint  Patient presents with   Medical Management of Chronic Issues    Patient came in today for a 5 month follow -up for Prediabetes, memory, Blood pressure   Spanish interpreter, Alfonso present.  Patient's granddaughter also present.  Patient is a 63 year old female seen for follow-up on chronic conditions.  Some increased memory loss since last OFV.  Recently seen by neurology.  Aricept  increased to 7.5 mg daily.  Patient has since moved in with her granddaughter.  Still working in housekeeping at hotel.  Patient notes her mother had Alzheimer's.  Patient endorses continued bilateral knee pain.  Worse when up walking.  Occasionally swells.  Denies pain and edema at this time.  Using topical analgesics and Mobic  as needed.  Patient states Voltaren gel helps but forgets to apply it while at work.  Patient mentions pain in right side of neck/jaw within the last few weeks.  Denies ear pain, popping, injury.    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   GERD (gastroesophageal reflux disease)    Hypertension    Irregular menstrual bleeding    Otitis externa    Systolic murmur 8/08   There is vigorous LV function. With this, there is slight increase in the gradient across the  pulmonic valve.   Varicose veins    Past Surgical History:  Procedure Laterality Date   none     Social History   Tobacco Use   Smoking status: Never   Smokeless tobacco: Never  Substance Use Topics   Alcohol use: No   Drug use: No   Family History  Problem Relation Age of Onset   Hypertension Mother    Allergies  Allergen Reactions   Tramadol  Nausea And Vomiting    ROS Negative unless stated above    Objective:     BP 118/64 (BP Location: Left Arm, Patient Position: Sitting, Cuff Size: Normal)   Pulse 67   Temp 97.7 F (36.5 C) (Oral)   Ht 5' 2 (1.575 m)   Wt 182 lb 6.4 oz (82.7 kg)   LMP 10/16/2008   SpO2 98%   BMI 33.36 kg/m  BP Readings from Last 3 Encounters:  11/29/23 118/64  11/18/23 124/73  06/27/23 110/74   Wt Readings from Last 3 Encounters:  11/29/23 182 lb 6.4 oz (82.7 kg)  11/18/23 187 lb (84.8 kg)  06/27/23 174 lb 12.8 oz (79.3 kg)      Physical Exam Constitutional:      General: She is not in acute distress.    Appearance: Normal appearance.  HENT:     Head: Normocephalic and atraumatic.     Nose: Nose normal.     Mouth/Throat:     Mouth: Mucous membranes are  moist.     Dentition: Abnormal dentition.      Comments: Edema of right mandible.  Poor dentition.  Several teeth on the right lower back with fillings.  3 chipped with filling. Cardiovascular:     Rate and Rhythm: Normal rate and regular rhythm.     Heart sounds: Normal heart sounds. No murmur heard.    No gallop.  Pulmonary:     Effort: Pulmonary effort is normal. No respiratory distress.     Breath sounds: Normal breath sounds. No wheezing, rhonchi or rales.  Musculoskeletal:     Right knee: Normal. No swelling, effusion or bony tenderness. Normal range of motion. No tenderness.     Left knee: Normal. No swelling, effusion or bony tenderness. Normal range of motion. No tenderness.     Comments: No crepitus bilaterally  Skin:    General: Skin is warm and dry.   Neurological:     Mental Status: She is alert and oriented to person, place, and time.        11/29/2023    2:26 PM 06/27/2023   11:01 AM 11/23/2022   11:18 AM  Depression screen PHQ 2/9  Decreased Interest 0 1 0  Down, Depressed, Hopeless 0 0 0  PHQ - 2 Score 0 1 0  Altered sleeping 0 1 0  Tired, decreased energy 0 1 0  Change in appetite 0 1 0  Feeling bad or failure about yourself  0 0 0  Trouble concentrating 2 0 0  Moving slowly or fidgety/restless 0 0 0  Suicidal thoughts 0 0 0  PHQ-9 Score 2 4 0  Difficult doing work/chores Somewhat difficult        11/29/2023    2:26 PM 06/27/2023   11:01 AM 11/23/2022   11:18 AM 06/04/2016   12:02 PM  GAD 7 : Generalized Anxiety Score  Nervous, Anxious, on Edge 2 2 0 0  Control/stop worrying 1 2 0 0  Worry too much - different things 0 1 1 0  Trouble relaxing 0 2 0 0  Restless 0 1 0 0  Easily annoyed or irritable 1 1 0 0  Afraid - awful might happen 0 0 0 0  Total GAD 7 Score 4 9 1  0  Anxiety Difficulty Somewhat difficult Somewhat difficult Not difficult at all      No results found for any visits on 11/29/23.    Assessment & Plan:   Memory loss -     Comprehensive metabolic panel with GFR; Future -     TSH; Future -     T4, free; Future -     CBC with Differential/Platelet; Future -     Lipid panel; Future -     Vitamin B12; Future  Prediabetes -     Hemoglobin A1c; Future  Essential hypertension -     Comprehensive metabolic panel with GFR; Future -     TSH; Future -     T4, free; Future -     Lipid panel; Future  Encounter for screening mammogram for malignant neoplasm of breast -     Digital Screening Mammogram, Left and Right; Future  Need for tetanus booster -     Tdap vaccine greater than or equal to 7yo IM  Need for shingles vaccine -     Varicella-zoster vaccine IM  Chronic pain of both knees -     DG Knee 1-2 Views Right; Future -     DG Knee 1-2 Views Left;  Future -     CBC with  Differential/Platelet; Future  Jaw pain -     Amoxicillin ; Take 1 tablet (500 mg total) by mouth 2 (two) times daily for 7 days.  Dispense: 14 tablet; Refill: 0  Memory loss subjectively increased.  Continue Aricept  10 mg per neurology.  Blood pressure well-controlled.  Continue lisinopril -hydrochlorothiazide   20-25 mg daily. Lifestyle modifications.  Lifestyle modifications for prediabetes.  Last hemoglobin A1c 5.7% on 06/27/2023.  Chronic bilateral knee pain likely 2/2 OA.  Will obtain imaging to confirm.  Supportive care including heat, ice, topical analgesics, Tylenol  or NSAIDs as needed.  For continued or worsening symptoms consider steroid injection and referral to Ortho.  Concern for possible dental infection causing jaw pain.  Start ABX.  Discussed other causes including TMJ.  Pt would like to avoid surgery.  Concern for possible dental infection causing R sided jaw pain.  Start abx.  Discussed other possible causes including TMJ, stress, bruxism, eustachian tube dysfunction.  Bi-yearly dental appts encouraged.    Tdap and shingles vaccine dose 1 given this visit.  I personally spent a total of 45 minutes in the care of the patient today including preparing to see the patient, getting/reviewing separately obtained history, performing a medically appropriate exam/evaluation, counseling and educating, placing orders, documenting clinical information in the EHR, communicating results, and coordinating care.   Return in about 3 months (around 02/29/2024), or if symptoms worsen or fail to improve.   Clotilda JONELLE Single, MD

## 2023-12-04 ENCOUNTER — Ambulatory Visit: Payer: Self-pay | Admitting: Family Medicine

## 2023-12-24 ENCOUNTER — Ambulatory Visit: Admission: RE | Admit: 2023-12-24 | Payer: Self-pay | Source: Ambulatory Visit

## 2024-03-31 ENCOUNTER — Telehealth: Payer: Self-pay

## 2024-03-31 DIAGNOSIS — I10 Essential (primary) hypertension: Secondary | ICD-10-CM

## 2024-03-31 MED ORDER — LISINOPRIL-HYDROCHLOROTHIAZIDE 20-25 MG PO TABS
1.0000 | ORAL_TABLET | Freq: Every day | ORAL | 3 refills | Status: AC
Start: 2024-03-31 — End: ?

## 2024-03-31 NOTE — Telephone Encounter (Signed)
 Copied from CRM [id number]. Topic: Clinical - Medication Question >> Mar 31, 2024 10:09 AM Deaijah H wrote: Reason for CRM: Vick Stallion would like to know if patient medicationS  donepezil  (ARICEPT ) 10 MG tablet ibuprofen (ADVIL,MOTRIN) 200 MG tablet lisinopril -hydrochlorothiazide  (ZESTORETIC ) 20-25 MG tablet be filled before 12/1 on in 12/2

## 2024-04-06 ENCOUNTER — Ambulatory Visit: Payer: Self-pay | Admitting: Family Medicine

## 2024-04-06 ENCOUNTER — Other Ambulatory Visit: Payer: Self-pay | Admitting: Family Medicine

## 2024-04-06 DIAGNOSIS — I10 Essential (primary) hypertension: Secondary | ICD-10-CM

## 2024-04-06 DIAGNOSIS — R413 Other amnesia: Secondary | ICD-10-CM

## 2024-04-06 MED ORDER — DONEPEZIL HCL 10 MG PO TABS: 10.0000 mg | ORAL_TABLET | Freq: Every day | ORAL | 0 refills | Status: AC

## 2024-04-06 MED ORDER — LISINOPRIL-HYDROCHLOROTHIAZIDE 20-25 MG PO TABS: 1.0000 | ORAL_TABLET | Freq: Every day | ORAL | 0 refills | Status: AC

## 2024-04-06 NOTE — Telephone Encounter (Signed)
 Done

## 2024-06-09 NOTE — Patient Instructions (Incomplete)
Below is our plan:  We will continue Aricept (donepezil) 5mg  daily for now. We can consider stopping this medication if you feel it is worsening tremor. We could consider changing/adding Namenda (memantine). I have included information in this packet.   Please make sure you are staying well hydrated. I recommend 50-60 ounces daily. Well balanced diet and regular exercise encouraged. Consistent sleep schedule with 6-8 hours recommended.   Please continue follow up with care team as directed.   Follow up with me in 6 months   You may receive a survey regarding today's visit. I encourage you to leave honest feed back as I do use this information to improve patient care. Thank you for seeing me today!   Management of Memory Problems   There are some general things you can do to help manage your memory problems.  Your memory may not in fact recover, but by using techniques and strategies you will be able to manage your memory difficulties better.   1)  Establish a routine. Try to establish and then stick to a regular routine.  By doing this, you will get used to what to expect and you will reduce the need to rely on your memory.  Also, try to do things at the same time of day, such as taking your medication or checking your calendar first thing in the morning. Think about think that you can do as a part of a regular routine and make a list.  Then enter them into a daily planner to remind you.  This will help you establish a routine.   2)  Organize your environment. Organize your environment so that it is uncluttered.  Decrease visual stimulation.  Place everyday items such as keys or cell phone in the same place every day (ie.  Basket next to front door) Use post it notes with a brief message to yourself (ie. Turn off light, lock the door) Use labels to indicate where things go (ie. Which cupboards are for food, dishes, etc.) Keep a notepad and pen by the telephone to take messages   3)  Memory  Aids A diary or journal/notebook/daily planner Making a list (shopping list, chore list, to do list that needs to be done) Using an alarm as a reminder (kitchen timer or cell phone alarm) Using cell phone to store information (Notes, Calendar, Reminders) Calendar/White board placed in a prominent position Post-it notes   In order for memory aids to be useful, you need to have good habits.  It's no good remembering to make a note in your journal if you don't remember to look in it.  Try setting aside a certain time of day to look in journal.   4)  Improving mood and managing fatigue. There may be other factors that contribute to memory difficulties.  Factors, such as anxiety, depression and tiredness can affect memory. Regular gentle exercise can help improve your mood and give you more energy. Exercise: there are short videos created by the [employer] on Health specially for older adults: https://bit.ly/2I30q97.  Mediterranean diet: which emphasizes fruits, vegetables, whole grains, legumes, fish, and other seafood; unsaturated fats such as olive oils; and low amounts of red meat, eggs, and sweets. A variation of this, called MIND (Mediterranean-DASH Intervention for Neurodegenerative Delay) incorporates the DASH (Dietary Approaches to Stop Hypertension) diet, which has been shown to lower high blood pressure, a risk factor for Alzheimer's disease. More information at: ExitMarketing.de.  Aerobic exercise that improve heart health is also  good for the mind.  [employer] on Aging have short videos for exercises that you can do at home: BlindWorkshop.com.pt Simple relaxation techniques may help relieve symptoms of anxiety Try to get back to completing activities or hobbies you enjoyed doing in the past. Learn to pace yourself through activities to decrease fatigue. Find out about some local support groups  where you can share experiences with others. Try and achieve 7-8 hours of sleep at night.   Resources for Family/Caregiver  Online caregiver support groups can be found at WesternTunes.it or call Alzheimer's Association's 24/7 hotline: (603)715-0386. [facility] offers in-person, virtual support groups and individual counseling for both care partners and persons with memory loss. Call for more information at [phone].   Advanced care plan: there are two types of Power of Attorney: healthcare and durable. Healthcare POA is a designated person to make healthcare decisions on your behalf if you were too sick to make them yourself. This person can be selected and documented by your physician. Durable POA has to be set up with a lawyer who takes charge of your finances and estate if you were too sick or cognitively impaired to manage your finances accurately. You can find a local Elder Therapist, art here: [place].at.  Check out www.planyourlifespan.org, which will help you plan before a crisis and decide who will take care of life considerations in a circumstance where you may not be able to speak for yourself.   Helpful books (available on [employer] or your local bookstore):  By Dr. Carl Best: Keeping Love Alive as Memories Fade: The 5 Love Languages and the Alzheimer's Journey Feb 05, 2015 The Dementia Care Partner's Workbook: A Guide for Understanding, Education, and [employer] - October 05, 2017.  Both available for less than $15.   "Coping with behavior change in dementia: a family caregiver's guide" by Ricardo Jericho & Valora Piccolo "A Caregiver's Guide to Dementia: Using Activities and Other Strategies to Prevent, Reduce and Manage Behavioral Symptoms" by Mahala Menghini Gitlin and [employer].  Youth worker of Joy for the Person with Alzheimer's or Dementia" 4th edition by Tama High  Caregiver videos on common behaviors related to dementia:  PopulationGame.pl  [place] Caregiver Portal: free to sign up, links to local resources: [email or web address]-[email or web address]/login    Tasks to improve attention/working memory 1. Good sleep hygiene (7-8 hrs of sleep) 2. Learning a new skill (Painting, Carpentry, Pottery, new language, Knitting). 3.Cognitive exercises (keep a daily journal, Puzzles) 4. Physical exercise and training  (30 min/day X 4 days week) 5. Being on Antidepressant if needed 6.Yoga, Meditation, Tai Chi 7. Decrease alcohol intake 8.Have a clear schedule and structure in daily routine   MIND Diet: The Mediterranean-DASH Diet Intervention for Neurodegenerative Delay, or MIND diet, targets the health of the aging brain. Research participants with the highest MIND diet scores had a significantly slower rate of cognitive decline compared with those with the lowest scores. The effects of the MIND diet on cognition showed greater effects than either the Mediterranean or the DASH diet alone.   The healthy items the MIND diet guidelines suggest include:   3+ servings a day of whole grains 1+ servings a day of vegetables (other than green leafy) 6+ servings a week of green leafy vegetables 5+ servings a week of nuts 4+ meals a week of beans 2+ servings a week of berries 2+ meals a week of poultry 1+ meals a week of fish Mainly olive oil if added fat is used  The unhealthy items, which are higher in saturated and trans fat, include: Less than 5 servings a week of pastries and sweets Less than 4 servings a week of red meat (including beef, pork, lamb, and products made from these meats) Less than one serving a week of cheese and fried foods Less than 1 tablespoon a day of butter/stick margarine

## 2024-06-15 ENCOUNTER — Ambulatory Visit: Payer: Self-pay | Admitting: Family Medicine

## 2024-06-15 DIAGNOSIS — R413 Other amnesia: Secondary | ICD-10-CM
